# Patient Record
Sex: Male | Born: 1935 | Race: Black or African American | Hispanic: No | Marital: Single
Health system: Southern US, Community
[De-identification: ages and names within clinical notes are randomized; demographics above are authoritative.]

---

## 2006-12-10 ENCOUNTER — Encounter (INDEPENDENT_AMBULATORY_CARE_PROVIDER_SITE_OTHER): Payer: Self-pay | Admitting: *Deleted

## 2006-12-10 ENCOUNTER — Ambulatory Visit (HOSPITAL_COMMUNITY): Admission: RE | Admit: 2006-12-10 | Discharge: 2006-12-11 | Payer: Self-pay | Admitting: Ophthalmology

## 2006-12-12 ENCOUNTER — Emergency Department: Payer: Self-pay | Admitting: Emergency Medicine

## 2008-07-23 ENCOUNTER — Emergency Department: Payer: Self-pay | Admitting: Emergency Medicine

## 2010-01-23 ENCOUNTER — Ambulatory Visit: Payer: Self-pay | Admitting: Family Medicine

## 2010-01-25 ENCOUNTER — Ambulatory Visit: Payer: Self-pay | Admitting: Internal Medicine

## 2010-01-28 ENCOUNTER — Ambulatory Visit: Payer: Self-pay | Admitting: Internal Medicine

## 2010-02-01 ENCOUNTER — Ambulatory Visit: Payer: Self-pay | Admitting: Family Medicine

## 2010-12-10 NOTE — Op Note (Signed)
NAME:  Wayne Cruz, Wayne Cruz NO.:  [id number]   MEDICAL RECORD NO.:  [id number]          PATIENT TYPE:  AMB   LOCATION:  SDS                          FACILITY:  [facility]   PHYSICIAN:  John D. Ashley Royalty, M.D. DATE OF BIRTH:  1909-09-14   DATE OF PROCEDURE:  12/10/2006  DATE OF DISCHARGE:                               OPERATIVE REPORT   ADMISSION DIAGNOSIS:  1. Dislocated intraocular lens into the vitreous right eye.  2. Retained lens material, right eye.  3. Capsular fragments, right eye.   PROCEDURES:  1. Pars plana vitrectomy with a 25-gauge system.  2. Removal of posterior capsule.  3. Removal of lens material from the vitreous.  4. Removal of intraocular lens from the vitreous.  5. Placement of secondary intraocular lens with suture.  All in the      right eye.  6. Also photocoagulation in the right eye.   SURGEON:  Beulah Gandy. Ashley Royalty, M.D.   ASSISTANT:  Bryan Lemma. Lundquist, P.A.   ANESTHESIA:  General.   DETAILS:  Usual prep and drape.  Peritomy from 8 o'clock around to 4  o'clock.  The scleral flaps were raised one half thickness at 3 o'clock  and 9 o'clock in anticipation of IOL suture.  Then 25-gauge trocars were  placed at 8 o'clock, 10 o'clock and 2 o'clock.  The 4-mm infusion port  anchored into place at 8 o'clock.  A diamond knife was used to create a  3-layered corneoscleral wound from 10 o'clock to 2 o'clock.  The 25-  gauge cutter and lighted pick were placed at 10 and 2 o'clock  respectively.  Provisc was placed on the corneal surface.   The pars plana vitrectomy was begun.  Vitreous detachment from the iris  to the wound was removed with the vitreous cutter.  The vitrectomy was  carried posteriorly and pieces of the lens were removed with suction and  cutting.  The vitrectomy was carried out into the periphery down to the  vitreous base for 360-degrees.  All lens material and vitreous was  removed.  The indirect ophthalmoscope laser was moved into  place, 897  burns were placed around the retinal periphery with a power between 1000  and 1200 milliwatts, 1000 microns each, and 0.1 seconds each.   The intraocular lens was ensnared in vitreous.  The vitreous was  trimmed, carefully, from the lens; and it was allowed to float freely in  the vitreous.  The corneoscleral wound was opened from 10 to 2 o'clock  with the diamond knife.  The 25-gauge forceps were used to grasp the  lens in the posterior segment, past the lens into the anterior segment,  and out through the corneoscleral wound.  Then two 10-0 Prolene sutures  were passed from 3 o'clock to 9 o'clock behind the iris and anterior to  the capsular remnants.  The sutures were externalized through the  corneal wound.  A new intraocular lens was brought out onto the field,  made by [employer]. model CZ70-BD power 23.0 D, length 12.5  mm, optic 7.0  mm, expiration date 02/2011, serial number [id number] 0-20.  The lens was brought out onto the field, inspected and cleaned.  It was  placed onto Gelfoam and onto the corneal surface. The sutures were  attached to the eyelets of the lens.  The lens was passed through the  corneal wound behind the iris into the ciliary sulcus.  The lens was  dialed into place and the Prolene sutures were drawn securely  externally.  These were sutured beneath the scleral flaps and trimmed.   The cornea was closed with five interrupted 10-0 nylon sutures.  It was  tested and found to be tight.  Then the 25-gauge trocars were removed;  and the wounds were held until sealed.  The conjunctiva was reposited  with 7-0 chromic suture.  Polymyxin and gentamicin were irrigated into  Tenon's space.  Marcaine was injected around the globe for postop pain.  Decadron 10 mg was injected into the lower and upper subconjunctival  space.  The closing pressure was 10 with a Baer keratometer.  TobraDex  ophthalmic ointment, a patch and shield were placed.  The  patient was  awakened and taken to recovery room in satisfactory condition.  Complications none.  Duration 1 hour 15 minutes.      Beulah Gandy. Ashley Royalty, M.D.  Electronically Signed     JDM/MEDQ  D:  12/10/2006  T:  12/10/2006  Job:  [id number]

## 2013-09-10 LAB — URINALYSIS, COMPLETE
BACTERIA: NONE SEEN
BILIRUBIN, UR: NEGATIVE
BLOOD: NEGATIVE
Glucose,UR: NEGATIVE mg/dL (ref 0–75)
Ketone: NEGATIVE
Leukocyte Esterase: NEGATIVE
Nitrite: NEGATIVE
PH: 7 (ref 4.5–8.0)
RBC,UR: 1 /HPF (ref 0–5)
Specific Gravity: 1.008 (ref 1.003–1.030)
Squamous Epithelial: NONE SEEN

## 2013-09-10 LAB — CBC WITH DIFFERENTIAL/PLATELET
BASOS ABS: 0 10*3/uL (ref 0.0–0.1)
BASOS PCT: 0.4 %
EOS ABS: 0 10*3/uL (ref 0.0–0.7)
EOS PCT: 0.2 %
HCT: 43.3 % (ref 40.0–52.0)
HGB: 14.3 g/dL (ref 12.0–16.0)
LYMPHS ABS: 0.9 10*3/uL — AB (ref 1.0–3.6)
Lymphocyte %: 13.9 %
MCH: 33.6 pg (ref 26.0–34.0)
MCHC: 33.1 g/dL (ref 32.0–36.0)
MCV: 102 fL — AB (ref 80–100)
MONOS PCT: 4.4 %
Monocyte #: 0.3 10*3/uL (ref 0.2–1.0)
Neutrophil #: 5.2 10*3/uL (ref 1.4–6.5)
Neutrophil %: 81.1 %
Platelet: 68 10*3/uL — ABNORMAL LOW (ref 150–440)
RBC: 4.26 10*6/uL — AB (ref 4.40–5.90)
RDW: 15 % — AB (ref 11.5–14.5)
WBC: 6.4 10*3/uL (ref 3.8–10.6)

## 2013-09-10 LAB — COMPREHENSIVE METABOLIC PANEL
ALK PHOS: 105 U/L
ALT: 17 U/L (ref 12–78)
Albumin: 3.5 g/dL (ref 3.4–5.0)
Anion Gap: 4 — ABNORMAL LOW (ref 7–16)
BILIRUBIN TOTAL: 1.7 mg/dL — AB (ref 0.2–1.0)
BUN: 14 mg/dL (ref 7–18)
CHLORIDE: 103 mmol/L (ref 98–107)
CO2: 31 mmol/L (ref 21–32)
CREATININE: 0.95 mg/dL (ref 0.60–1.30)
Calcium, Total: 8.5 mg/dL (ref 8.5–10.1)
EGFR (Non-African Amer.): >60
GLUCOSE: 127 mg/dL — AB (ref 65–99)
OSMOLALITY: 278 (ref 275–301)
Potassium: 4.1 mmol/L (ref 3.5–5.1)
SGOT(AST): 25 U/L (ref 15–37)
Sodium: 138 mmol/L (ref 136–145)
TOTAL PROTEIN: 7.6 g/dL (ref 6.4–8.2)

## 2013-09-11 ENCOUNTER — Ambulatory Visit: Payer: Self-pay | Admitting: Urology

## 2013-09-11 LAB — BASIC METABOLIC PANEL
ANION GAP: 7 (ref 7–16)
BUN: 16 mg/dL (ref 7–18)
CALCIUM: 8.8 mg/dL (ref 8.5–10.1)
CREATININE: 1.22 mg/dL (ref 0.60–1.30)
Chloride: 103 mmol/L (ref 98–107)
Co2: 28 mmol/L (ref 21–32)
EGFR (Non-African Amer.): 48 — ABNORMAL LOW
GFR CALC AF AMER: 55 — AB
Glucose: 124 mg/dL — ABNORMAL HIGH (ref 65–99)
OSMOLALITY: 278 (ref 275–301)
POTASSIUM: 3.7 mmol/L (ref 3.5–5.1)
SODIUM: 138 mmol/L (ref 136–145)

## 2013-09-11 LAB — PLATELET COUNT: PLATELETS: 76 10*3/uL — AB (ref 150–440)

## 2013-09-12 ENCOUNTER — Inpatient Hospital Stay: Payer: Self-pay | Admitting: Surgery

## 2013-09-13 LAB — COMPREHENSIVE METABOLIC PANEL
ALBUMIN: 3.2 g/dL — AB (ref 3.4–5.0)
ALK PHOS: 76 U/L
ALT: 29 U/L (ref 12–78)
AST: 92 U/L — AB (ref 15–37)
Anion Gap: 4 — ABNORMAL LOW (ref 7–16)
BILIRUBIN TOTAL: 2.7 mg/dL — AB (ref 0.2–1.0)
BUN: 29 mg/dL — ABNORMAL HIGH (ref 7–18)
CHLORIDE: 104 mmol/L (ref 98–107)
CO2: 31 mmol/L (ref 21–32)
Calcium, Total: 8.5 mg/dL (ref 8.5–10.1)
Creatinine: 1.12 mg/dL (ref 0.60–1.30)
EGFR (African American): >60
GFR CALC NON AF AMER: 53 — AB
Glucose: 106 mg/dL — ABNORMAL HIGH (ref 65–99)
OSMOLALITY: 284 (ref 275–301)
POTASSIUM: 3.9 mmol/L (ref 3.5–5.1)
SODIUM: 139 mmol/L (ref 136–145)
Total Protein: 6.9 g/dL (ref 6.4–8.2)

## 2013-09-13 LAB — URINALYSIS, COMPLETE
Bacteria: NONE SEEN
RBC,UR: 9978 /HPF (ref 0–5)
SPECIFIC GRAVITY: 1.02 (ref 1.003–1.030)
WBC UR: 91 /HPF (ref 0–5)

## 2013-09-13 LAB — CBC WITH DIFFERENTIAL/PLATELET
Basophil #: 0 10*3/uL (ref 0.0–0.1)
Basophil %: 0.4 %
Eosinophil #: 0 10*3/uL (ref 0.0–0.7)
Eosinophil %: 0.4 %
HCT: 37.6 % — AB (ref 40.0–52.0)
HGB: 12.4 g/dL (ref 12.0–16.0)
LYMPHS ABS: 0.7 10*3/uL — AB (ref 1.0–3.6)
LYMPHS PCT: 8.4 %
MCH: 33.8 pg (ref 26.0–34.0)
MCHC: 33 g/dL (ref 32.0–36.0)
MCV: 103 fL — ABNORMAL HIGH (ref 80–100)
MONO ABS: 0.9 10*3/uL (ref 0.2–1.0)
MONOS PCT: 10.4 %
NEUTROS ABS: 6.9 10*3/uL — AB (ref 1.4–6.5)
Neutrophil %: 80.4 %
Platelet: 71 10*3/uL — ABNORMAL LOW (ref 150–440)
RBC: 3.67 10*6/uL — AB (ref 4.40–5.90)
RDW: 14.8 % — AB (ref 11.5–14.5)
WBC: 8.5 10*3/uL (ref 3.8–10.6)

## 2013-09-13 LAB — PATHOLOGY REPORT

## 2013-09-17 LAB — PLATELET COUNT: PLATELETS: 95 10*3/uL — AB (ref 150–440)

## 2013-09-21 ENCOUNTER — Inpatient Hospital Stay: Payer: Self-pay | Admitting: Internal Medicine

## 2013-09-21 LAB — URINALYSIS, COMPLETE
BILIRUBIN, UR: NEGATIVE
Glucose,UR: NEGATIVE mg/dL (ref 0–75)
NITRITE: NEGATIVE
Ph: 8 (ref 4.5–8.0)
Protein: 30
Specific Gravity: 1.014 (ref 1.003–1.030)

## 2013-09-21 LAB — COMPREHENSIVE METABOLIC PANEL
ALBUMIN: 3.1 g/dL — AB (ref 3.4–5.0)
ALK PHOS: 64 U/L
AST: 52 U/L — AB (ref 15–37)
Anion Gap: 3 — ABNORMAL LOW (ref 7–16)
BILIRUBIN TOTAL: 2 mg/dL — AB (ref 0.2–1.0)
BUN: 14 mg/dL (ref 7–18)
CALCIUM: 8.2 mg/dL — AB (ref 8.5–10.1)
CO2: 37 mmol/L — AB (ref 21–32)
Chloride: 98 mmol/L (ref 98–107)
Creatinine: 0.98 mg/dL (ref 0.60–1.30)
EGFR (African American): >60
EGFR (Non-African Amer.): >60
GLUCOSE: 111 mg/dL — AB (ref 65–99)
Osmolality: 277 (ref 275–301)
Potassium: 3.2 mmol/L — ABNORMAL LOW (ref 3.5–5.1)
SGPT (ALT): 40 U/L (ref 12–78)
SODIUM: 138 mmol/L (ref 136–145)
TOTAL PROTEIN: 6.9 g/dL (ref 6.4–8.2)

## 2013-09-21 LAB — PROTIME-INR
INR: 1.2
Prothrombin Time: 15.2 secs — ABNORMAL HIGH (ref 11.5–14.7)

## 2013-09-21 LAB — CBC
HCT: 36.9 % — ABNORMAL LOW (ref 40.0–52.0)
HGB: 12.2 g/dL (ref 12.0–16.0)
MCH: 33.5 pg (ref 26.0–34.0)
MCHC: 33.1 g/dL (ref 32.0–36.0)
MCV: 101 fL — AB (ref 80–100)
Platelet: 105 10*3/uL — ABNORMAL LOW (ref 150–440)
RBC: 3.64 10*6/uL — ABNORMAL LOW (ref 4.40–5.90)
RDW: 14.8 % — ABNORMAL HIGH (ref 11.5–14.5)
WBC: 4.9 10*3/uL (ref 3.8–10.6)

## 2013-09-21 LAB — CK-MB
CK-MB: 4.3 ng/mL — AB (ref 0.5–3.6)
CK-MB: 4.3 ng/mL — ABNORMAL HIGH (ref 0.5–3.6)
CK-MB: 4.7 ng/mL — AB (ref 0.5–3.6)

## 2013-09-21 LAB — TROPONIN I
TROPONIN-I: 4.1 ng/mL — AB
TROPONIN-I: 4.2 ng/mL — AB
Troponin-I: 4.3 ng/mL — ABNORMAL HIGH
Troponin-I: 4.1 ng/mL — ABNORMAL HIGH

## 2013-09-21 LAB — APTT: ACTIVATED PTT: 32.2 s (ref 23.6–35.9)

## 2013-09-22 LAB — CBC WITH DIFFERENTIAL/PLATELET
BASOS PCT: 0.7 %
Basophil #: 0 10*3/uL (ref 0.0–0.1)
Eosinophil #: 0 10*3/uL (ref 0.0–0.7)
Eosinophil %: 0.8 %
HCT: 33.7 % — ABNORMAL LOW (ref 40.0–52.0)
HGB: 11 g/dL — AB (ref 12.0–16.0)
Lymphocyte #: 0.5 10*3/uL — ABNORMAL LOW (ref 1.0–3.6)
Lymphocyte %: 14.1 %
MCH: 33.5 pg (ref 26.0–34.0)
MCHC: 32.8 g/dL (ref 32.0–36.0)
MCV: 102 fL — ABNORMAL HIGH (ref 80–100)
MONO ABS: 0.6 10*3/uL (ref 0.2–1.0)
MONOS PCT: 16.2 %
NEUTROS ABS: 2.6 10*3/uL (ref 1.4–6.5)
Neutrophil %: 68.2 %
PLATELETS: 87 10*3/uL — AB (ref 150–440)
RBC: 3.3 10*6/uL — ABNORMAL LOW (ref 4.40–5.90)
RDW: 14.9 % — AB (ref 11.5–14.5)
WBC: 3.8 10*3/uL (ref 3.8–10.6)

## 2013-09-22 LAB — BASIC METABOLIC PANEL
ANION GAP: 3 — AB (ref 7–16)
BUN: 11 mg/dL (ref 7–18)
CALCIUM: 7.5 mg/dL — AB (ref 8.5–10.1)
CHLORIDE: 99 mmol/L (ref 98–107)
CO2: 36 mmol/L — AB (ref 21–32)
Creatinine: 0.88 mg/dL (ref 0.60–1.30)
EGFR (African American): >60
GLUCOSE: 85 mg/dL (ref 65–99)
OSMOLALITY: 274 (ref 275–301)
POTASSIUM: 3.3 mmol/L — AB (ref 3.5–5.1)
Sodium: 138 mmol/L (ref 136–145)

## 2013-09-22 LAB — PROTIME-INR
INR: 1.2
Prothrombin Time: 15.4 secs — ABNORMAL HIGH (ref 11.5–14.7)

## 2013-09-22 LAB — MAGNESIUM: MAGNESIUM: 1.5 mg/dL — AB

## 2013-09-22 LAB — LIPID PANEL
Cholesterol: 79 mg/dL (ref 0–200)
HDL: 40 mg/dL (ref 40–60)
LDL CHOLESTEROL, CALC: 26 mg/dL (ref 0–100)
TRIGLYCERIDES: 65 mg/dL (ref 0–200)
VLDL Cholesterol, Calc: 13 mg/dL (ref 5–40)

## 2013-09-26 LAB — CULTURE, BLOOD (SINGLE)

## 2013-12-26 ENCOUNTER — Ambulatory Visit: Payer: Self-pay | Admitting: Internal Medicine

## 2014-01-02 ENCOUNTER — Inpatient Hospital Stay: Payer: Self-pay | Admitting: Internal Medicine

## 2014-01-02 LAB — CBC
HCT: 37.2 % — ABNORMAL LOW (ref 40.0–52.0)
HGB: 11.9 g/dL — ABNORMAL LOW (ref 12.0–16.0)
MCH: 33.5 pg (ref 26.0–34.0)
MCHC: 32.1 g/dL (ref 32.0–36.0)
MCV: 104 fL — AB (ref 80–100)
Platelet: 65 10*3/uL — ABNORMAL LOW (ref 150–440)
RBC: 3.56 10*6/uL — ABNORMAL LOW (ref 4.40–5.90)
RDW: 16.1 % — ABNORMAL HIGH (ref 11.5–14.5)
WBC: 3.9 10*3/uL (ref 3.8–10.6)

## 2014-01-02 LAB — CK-MB
CK-MB: 2 ng/mL (ref 0.5–3.6)
CK-MB: 2.1 ng/mL (ref 0.5–3.6)
CK-MB: 2.2 ng/mL (ref 0.5–3.6)

## 2014-01-02 LAB — COMPREHENSIVE METABOLIC PANEL
ALT: 25 U/L (ref 12–78)
ANION GAP: 6 — AB (ref 7–16)
Albumin: 3.3 g/dL — ABNORMAL LOW (ref 3.4–5.0)
Alkaline Phosphatase: 55 U/L
BUN: 17 mg/dL (ref 7–18)
Bilirubin,Total: 2.8 mg/dL — ABNORMAL HIGH (ref 0.2–1.0)
CHLORIDE: 95 mmol/L — AB (ref 98–107)
Calcium, Total: 8.5 mg/dL (ref 8.5–10.1)
Co2: 36 mmol/L — ABNORMAL HIGH (ref 21–32)
Creatinine: 0.88 mg/dL (ref 0.60–1.30)
EGFR (African American): >60
EGFR (Non-African Amer.): >60
GLUCOSE: 116 mg/dL — AB (ref 65–99)
Osmolality: 276 (ref 275–301)
POTASSIUM: 2.7 mmol/L — AB (ref 3.5–5.1)
SGOT(AST): 25 U/L (ref 15–37)
SODIUM: 137 mmol/L (ref 136–145)
Total Protein: 7.2 g/dL (ref 6.4–8.2)

## 2014-01-02 LAB — LIPASE, BLOOD: Lipase: 91 U/L (ref 73–393)

## 2014-01-02 LAB — MAGNESIUM: Magnesium: 2 mg/dL

## 2014-01-02 LAB — TROPONIN I
TROPONIN-I: 0.61 ng/mL — AB
Troponin-I: 0.52 ng/mL — ABNORMAL HIGH
Troponin-I: 0.68 ng/mL — ABNORMAL HIGH

## 2014-01-02 LAB — URINALYSIS, COMPLETE
BLOOD: NEGATIVE
Bacteria: NONE SEEN
Bilirubin,UR: NEGATIVE
GLUCOSE, UR: NEGATIVE mg/dL (ref 0–75)
Leukocyte Esterase: NEGATIVE
NITRITE: NEGATIVE
PH: 8 (ref 4.5–8.0)
Protein: 100
RBC,UR: 1 /HPF (ref 0–5)
Specific Gravity: 1.018 (ref 1.003–1.030)
Squamous Epithelial: <1
WBC UR: <1 /HPF (ref 0–5)

## 2014-01-02 LAB — PRO B NATRIURETIC PEPTIDE: B-TYPE NATIURETIC PEPTID: 11172 pg/mL — AB (ref 0–125)

## 2014-01-03 LAB — CBC WITH DIFFERENTIAL/PLATELET
BASOS PCT: 0.1 %
Basophil #: 0 10*3/uL (ref 0.0–0.1)
EOS ABS: 0 10*3/uL (ref 0.0–0.7)
EOS PCT: 0.2 %
HCT: 34.4 % — ABNORMAL LOW (ref 40.0–52.0)
HGB: 10.8 g/dL — ABNORMAL LOW (ref 12.0–16.0)
Lymphocyte #: 0.4 10*3/uL — ABNORMAL LOW (ref 1.0–3.6)
Lymphocyte %: 15.4 %
MCH: 33 pg (ref 26.0–34.0)
MCHC: 31.4 g/dL — ABNORMAL LOW (ref 32.0–36.0)
MCV: 105 fL — ABNORMAL HIGH (ref 80–100)
MONOS PCT: 21.8 %
Monocyte #: 0.6 10*3/uL (ref 0.2–1.0)
NEUTROS ABS: 1.8 10*3/uL (ref 1.4–6.5)
NEUTROS PCT: 62.5 %
PLATELETS: 63 10*3/uL — AB (ref 150–440)
RBC: 3.27 10*6/uL — AB (ref 4.40–5.90)
RDW: 15.6 % — AB (ref 11.5–14.5)
WBC: 2.8 10*3/uL — ABNORMAL LOW (ref 3.8–10.6)

## 2014-01-03 LAB — BASIC METABOLIC PANEL
ANION GAP: 5 — AB (ref 7–16)
BUN: 15 mg/dL (ref 7–18)
CHLORIDE: 98 mmol/L (ref 98–107)
Calcium, Total: 7.5 mg/dL — ABNORMAL LOW (ref 8.5–10.1)
Co2: 37 mmol/L — ABNORMAL HIGH (ref 21–32)
Creatinine: 0.79 mg/dL (ref 0.60–1.30)
EGFR (African American): >60
GLUCOSE: 106 mg/dL — AB (ref 65–99)
Osmolality: 281 (ref 275–301)
Potassium: 2.6 mmol/L — ABNORMAL LOW (ref 3.5–5.1)
Sodium: 140 mmol/L (ref 136–145)

## 2014-01-03 LAB — TSH: THYROID STIMULATING HORM: 1.23 u[IU]/mL

## 2014-01-04 LAB — CBC WITH DIFFERENTIAL/PLATELET
Basophil #: 0 10*3/uL (ref 0.0–0.1)
Basophil %: 0.1 %
EOS ABS: 0 10*3/uL (ref 0.0–0.7)
EOS PCT: 0.6 %
HCT: 36 % — ABNORMAL LOW (ref 40.0–52.0)
HGB: 11.6 g/dL — AB (ref 12.0–16.0)
LYMPHS ABS: 0.8 10*3/uL — AB (ref 1.0–3.6)
Lymphocyte %: 22 %
MCH: 33.7 pg (ref 26.0–34.0)
MCHC: 32.1 g/dL (ref 32.0–36.0)
MCV: 105 fL — AB (ref 80–100)
Monocyte #: 0.6 10*3/uL (ref 0.2–1.0)
Monocyte %: 17.5 %
Neutrophil #: 2.1 10*3/uL (ref 1.4–6.5)
Neutrophil %: 59.8 %
Platelet: 66 10*3/uL — ABNORMAL LOW (ref 150–440)
RBC: 3.43 10*6/uL — ABNORMAL LOW (ref 4.40–5.90)
RDW: 15.7 % — ABNORMAL HIGH (ref 11.5–14.5)
WBC: 3.6 10*3/uL — ABNORMAL LOW (ref 3.8–10.6)

## 2014-01-04 LAB — BASIC METABOLIC PANEL
ANION GAP: 5 — AB (ref 7–16)
BUN: 13 mg/dL (ref 7–18)
CALCIUM: 7.4 mg/dL — AB (ref 8.5–10.1)
CHLORIDE: 101 mmol/L (ref 98–107)
CO2: 36 mmol/L — AB (ref 21–32)
CREATININE: 0.96 mg/dL (ref 0.60–1.30)
EGFR (African American): >60
EGFR (Non-African Amer.): >60
GLUCOSE: 101 mg/dL — AB (ref 65–99)
Osmolality: 283 (ref 275–301)
POTASSIUM: 2.7 mmol/L — AB (ref 3.5–5.1)
Sodium: 142 mmol/L (ref 136–145)

## 2014-01-25 ENCOUNTER — Ambulatory Visit: Payer: Self-pay | Admitting: Internal Medicine

## 2014-02-25 DEATH — deceased

## 2014-11-18 NOTE — Consult Note (Signed)
   Comments   Spoke with wife and daughter. Both understand that patient does not seem to be doing well. We discussed option of continuing present care vs a transition to comfort care. Also discussed the Hospice Home. Wife would like to proceed with transfer to the Hospice Home. SW notified and will have the Hospice Liaison coordinate. Orders entered.  Electronic Signatures: Borders, Daryl EasternJoshua R (NP)  (Signed 10-Jun-15 14:36)  Authored: Palliative Care Phifer, Harriett SineNancy (MD)  (Signed 10-Jun-15 20:59)  Authored: Palliative Care   Last Updated: 10-Jun-15 20:59 by Phifer, Harriett SineNancy (MD)

## 2014-11-18 NOTE — Consult Note (Signed)
Brief Consult Note: Diagnosis: SBO.   Patient was seen by consultant.   Consult note dictated.   Recommend further assessment or treatment.   Orders entered.   Comments: Unable to obtain Hx except from chart. PE suggests SBO and is confirmed on CT. Agree with NG andreexamination.  Electronic Signatures: Lattie Hawooper, Rhyse Skowron E (MD)  (Signed 08-Jun-15 20:25)  Authored: Brief Consult Note   Last Updated: 08-Jun-15 20:25 by Lattie Hawooper, Jonmarc Bodkin E (MD)

## 2014-11-18 NOTE — H&P (Signed)
 Subjective/Chief Complaint painful VH   History of Present Illness sudden onset pain this am, no prior episode. no n/v no f/c unable to reduce in ED   Past History CAD, stents, no plavix  2 yrs ago HTN no prior abdominal surgery had TURp   Past Medical Health Coronary Artery Disease, Hypertension   Past Med/Surgical Hx:  palpitations:   cataract surgery:   ALLERGIES:  No Known Allergies:   Family and Social History:  Family History Non-Contributory   Social History negative tobacco, negative ETOH   Place of Living Home   Review of Systems:  Fever/Chills No   Cough No   Abdominal Pain Yes   Diarrhea No   Constipation No   Nausea/Vomiting No   SOB/DOE No   Chest Pain No   Dysuria No   Medications/Allergies Reviewed Medications/Allergies reviewed   Physical Exam:  GEN no acute distress   HEENT pink conjunctivae   NECK supple   RESP normal resp effort  clear BS   CARD regular rate   ABD positive tenderness  positive hernia  soft  nonreducible SUVH, no erythema,tender   LYMPH negative neck   EXTR negative edema   SKIN normal to palpation   PSYCH alert, A+O to time, place, person   Lab Results: Hepatic:  14-Feb-15 13:13   Albumin, Serum 3.5  Lab:  14-Feb-15 14:00   Lactic Acid, Cardiopulmonary  2.0 (Result(s) reported on 10 Sep 2013 at 02:46PM.)  Routine Chem:  14-Feb-15 13:13   Glucose, Serum  127  BUN 14  Creatinine (comp) 0.95  Sodium, Serum 138  Potassium, Serum 4.1  Chloride, Serum 103  CO2, Serum 31  Calcium (Total), Serum 8.5  Osmolality (calc) 278  eGFR (African American) >60  eGFR (Non-African American) >60 (eGFR values <60mL/min/1.73 m2 may be an indication of chronic kidney disease (CKD). Calculated eGFR is useful in patients with stable renal function. The eGFR calculation will not be reliable in acutely ill patients when serum creatinine is changing rapidly. It is not useful in  patients on dialysis. The eGFR  calculation may not be applicable to patients at the low and high extremes of body sizes, pregnant women, and vegetarians.)  Anion Gap  4  Routine UA:  14-Feb-15 13:14   Color (UA) Yellow  Clarity (UA) Clear  Glucose (UA) Negative  Bilirubin (UA) Negative  Ketones (UA) Negative  Specific Gravity (UA) 1.008  Blood (UA) Negative  pH (UA) 7.0  Protein (UA) 30 mg/dL  Nitrite (UA) Negative  Leukocyte Esterase (UA) Negative (Result(s) reported on 10 Sep 2013 at 02:15PM.)  RBC (UA) 1 /HPF  WBC (UA) 2 /HPF  Bacteria (UA) NONE SEEN  Epithelial Cells (UA) NONE SEEN  Result(s) reported on 10 Sep 2013 at 02:15PM.  Routine Hem:  14-Feb-15 13:13   WBC (CBC) 6.4  RBC (CBC)  4.26  Hemoglobin (CBC) 14.3  Hematocrit (CBC) 43.3  MCV  102  MCH 33.6  MCHC 33.1  RDW  15.0  Neutrophil % 81.1  Lymphocyte % 13.9  Monocyte % 4.4  Eosinophil % 0.2  Basophil % 0.4  Neutrophil # 5.2  Lymphocyte #  0.9  Monocyte # 0.3  Eosinophil # 0.0  Basophil # 0.0 (Result(s) reported on 10 Sep 2013 at 02:15PM.)    Assessment/Admission Diagnosis inc VH, not reducible by me or ED MD rec urgent repair risks and options in detail   Electronic Signatures: Cooper, Richard E (MD)  (Signed 14-Feb-15 14:52)  Authored: CHIEF COMPLAINT and   HISTORY, PAST MEDICAL/SURGIAL HISTORY, ALLERGIES, FAMILY AND SOCIAL HISTORY, REVIEW OF SYSTEMS, PHYSICAL EXAM, LABS, ASSESSMENT AND PLAN   Last Updated: 14-Feb-15 14:52 by Florene Glen (MD)

## 2014-11-18 NOTE — Consult Note (Signed)
Admit Diagnosis:   HERNIA REPAIR: Onset Date: 11-Sep-2013, Status: Active, Description: HERNIA REPAIR      Admit Reason:   Incarcerated ventral hernia (552.20): Onset Date: 10-Sep-2013, Status: Active, Coding System: ICD9, Coded Name: Ventral hernia, unspecified, with obstruction  Home Medications: Medication Instructions Status  acetaminophen-HYDROcodone 325 mg-5 mg oral tablet 1 tab(s) orally every 4 hours, As needed, pain Active  furosemide 20 mg oral tablet 1 tab(s) orally once a Aoun Active  finasteride 5 mg oral tablet 1 tab(s) orally once a Shatzer Active  Metoprolol Tartrate 25 mg oral tablet 1 tab(s) orally 2 times a Mcinerny Active  atorvastatin 80 mg oral tablet 1 tab(s) orally once a Makarewicz Active  ranitidine 150 mg oral tablet 1 tab(s) orally once a Gittings Active  aspirin 81 mg oral tablet, chewable 1 tab(s) orally once a Eppes Active  tamsulosin 0.4 mg oral capsule 1 cap(s) orally once a Kidd Active   Lab Results: Routine Chem:  15-Feb-15 04:58   Glucose, Serum  124  BUN 16  Creatinine (comp) 1.22  Sodium, Serum 138  Potassium, Serum 3.7  Chloride, Serum 103  CO2, Serum 28  Calcium (Total), Serum 8.8  Anion Gap 7  Osmolality (calc) 278  eGFR (African American)  55  eGFR (Non-African American)  48 (eGFR values <78mL/min/1.73 m2 may be an indication of chronic kidney disease (CKD). Calculated eGFR is useful in patients with stable renal function. The eGFR calculation will not be reliable in acutely ill patients when serum creatinine is changing rapidly. It is not useful in  patients on dialysis. The eGFR calculation may not be applicable to patients at the low and high extremes of body sizes, pregnant women, and vegetarians.)  Routine Hem:  15-Feb-15 04:58   Platelet Count (CBC)  76 (Result(s) reported on 11 Sep 2013 at 06:37AM.)    No Known Allergies:   Nursing Flowsheets: **Vital Signs.:   15-Feb-15 10:24  Vital Signs Type Q 4hr  Temperature Temperature (F) 99   Celsius 37.2  Pulse Pulse 90  Respirations Respirations 18  Systolic BP Systolic BP 259  Diastolic BP (mmHg) Diastolic BP (mmHg) 58  Mean BP 77  Pulse Ox % Pulse Ox % 93  Pulse Ox Activity Level  At rest  Oxygen Delivery 3L    Present Illness 81 yo male s/p ventral hernia repair with acute urinary retention.  Foley placement attempted with regular and coude catheters without success.  Patient reportedly has a history of TURP in the past.  800 ml on bladder scan.  No other known urologic history.  He is on flomax.   Case History and Physical Exam:  Chief Complaint Acute urinary retention   Past Medical Health Coronary Artery Disease, Hypertension   Past Surgical History ventral hernia repair   Family History Non-Contributory   HEENT PERLA   Neck/Nodes Supple   Chest/Lungs Clear   Cardiovascular Normal Sinus Rhythm   Abdomen Benign   Genitalia WNL   Rectal Not examined    Impression Acute urinary retention   Plan -Under sterile conditions, a 20 Fr coude catheter was passed easily and atraumatically into the bladder with return of clear yellow urine.  Continue flomax.  OK to try another voiding trial in 48 hours or so.  If foley needs to be replaced, a 20 Fr coude should be used and can be placed by nursing staff since no special maneuvers were required for placement.  Follow up with home urologist.   Electronic Signatures: Lenoria Chime  J (MD)  (Signed 15-Feb-15 13:29)  Authored: Health Issues, Home Medications, Labs, Allergies, Vital Signs, General Aspect/Present Illness, History and Physical Exam, Impression/Plan   Last Updated: 15-Feb-15 13:29 by Burnard Hawthorne (MD)

## 2014-11-18 NOTE — Consult Note (Signed)
General Aspect 81 year old African American male brought to the ER, from a local nursing facility, due to cough and chest x-ray showing possible pneumothorax but CT of the chest here revealed small right pleural effusion and diffuse CAD by CT.  Patient has been very hypertensive with blood pressures running 170-180.   Troponin is elevated at 4.20.  EKG shows chronic A. fib, rate controlled.  No acute ST-T wave changes.Patient is noncommunicative with me during the visit, no family present and history is obtained from the chart.  Patient did present also with low platelets at 95, now 105.  He is on oxygen, nitroglycerin, baby aspirin, will continue, but no other anticoagulants due to low platelets, beta blocker, statin.Echocardiogram will be obtained today.  Patient does have history of hematuria and was seen by urologist on an outpatient basis.  Hematuria thought secondary to BPH.   Physical Exam:  GEN no acute distress   HEENT pink conjunctivae, hearing intact to voice, moist oral mucosa   RESP normal resp effort  crackles  Congestive cough   CARD Irregular rate and rhythm  Chronic A. fib, rate controlled   ABD soft   EXTR negative edema, 2+   SKIN skin turgor decreased   PSYCH lethargic, Will open eyes to voice but noncommunicative   Review of Systems:  Subjective/Chief Complaint Congested cough with abnormal chest x-ray   ROS Pt not able to provide ROS   Medications/Allergies Reviewed Medications/Allergies reviewed   Lab Results: Hepatic:  24-Feb-15 23:51   Bilirubin, Total  2.0  Alkaline Phosphatase 64 (45-117 NOTE: New Reference Range 06/17/13)  SGPT (ALT) 40  SGOT (AST)  52  Total Protein, Serum 6.9  Albumin, Serum  3.1  Routine Chem:  24-Feb-15 23:51   Result Comment TROPONIN - RESULTS VERIFIED BY REPEAT TESTING.  - CALLED TO AMANDA NORTHRUP AT 5701 ON  - 09/21/13.Marland KitchenMarland KitchenTPL  - READ-BACK PROCESS PERFORMED.  Result(s) reported on 21 Sep 2013 at 12:51AM.  Glucose,  Serum  111  BUN 14  Creatinine (comp) 0.98  Sodium, Serum 138  Potassium, Serum  3.2  Chloride, Serum 98  CO2, Serum  37  Calcium (Total), Serum  8.2  Osmolality (calc) 277  eGFR (African American) >60  eGFR (Non-African American) >60 (eGFR values <29m/min/1.73 m2 may be an indication of chronic kidney disease (CKD). Calculated eGFR is useful in patients with stable renal function. The eGFR calculation will not be reliable in acutely ill patients when serum creatinine is changing rapidly. It is not useful in  patients on dialysis. The eGFR calculation may not be applicable to patients at the low and high extremes of body sizes, pregnant women, and vegetarians.)  Anion Gap  3  Cardiac:  24-Feb-15 23:51   Troponin I  4.20 (0.00-0.05 0.05 ng/mL or less: NEGATIVE  Repeat testing in 3-6 hrs  if clinically indicated. >0.05 ng/mL: POTENTIAL  MYOCARDIAL INJURY. Repeat  testing in 3-6 hrs if  clinically indicated. NOTE: An increase or decrease  of 30% or more on serial  testing suggests a  clinically important change)  CPK-MB, Serum  4.7 (Result(s) reported on 21 Sep 2013 at 01:51AM.)  Routine Coag:  24-Feb-15 23:51   Prothrombin  15.2  INR 1.2 (INR reference interval applies to patients on anticoagulant therapy. A single INR therapeutic range for coumarins is not optimal for all indications; however, the suggested range for most indications is 2.0 - 3.0. Exceptions to the INR Reference Range may include: Prosthetic heart valves, acute myocardial infarction,  prevention of myocardial infarction, and combinations of aspirin and anticoagulant. The need for a higher or lower target INR must be assessed individually. Reference: The Pharmacology and Management of the Vitamin K  antagonists: the seventh ACCP Conference on Antithrombotic and Thrombolytic Therapy. KAJGO.1157 Sept:126 (3suppl): [id number]. A HCT value >55% may artifactually increase the PT.  In one study,  the increase  was an average of 25%. Reference:  "Effect on Routine and Special Coagulation Testing Values of Citrate Anticoagulant Adjustment in Patients with High HCT Values." American Journal of Clinical Pathology 2006;126:400-405.)  Activated PTT (APTT) 32.2 (A HCT value >55% may artifactually increase the APTT. In one study, the increase was an average of 19%. Reference: "Effect on Routine and Special Coagulation Testing Values of Citrate Anticoagulant Adjustment in Patients with High HCT Values." American Journal of Clinical Pathology 2006;126:400-405.)  Routine Hem:  24-Feb-15 23:51   WBC (CBC) 4.9  RBC (CBC)  3.64  Hemoglobin (CBC) 12.2  Hematocrit (CBC)  36.9  Platelet Count (CBC)  105 (Result(s) reported on 21 Sep 2013 at 12:10AM.)  MCV  101  MCH 33.5  MCHC 33.1  RDW  14.8   Radiology Results: CT:    25-Feb-15 00:06, CT Chest Without Contrast  CT Chest Without Contrast   REASON FOR EXAM:    sent from nursing home with possible pneumothorax,   cough with productive sputum  COMMENTS:       PROCEDURE: CT  - CT CHEST WITHOUT CONTRAST  - Sep 21 2013 12:06AM     CLINICAL DATA:  Productive cough. Question of pneumothorax onchest  radiograph performed at outside facility.    EXAM:  CT CHEST WITHOUT CONTRAST    TECHNIQUE:  Multidetector CT imaging of the chest was performed following the  standard protocol without IV contrast.  COMPARISON:  None.    FINDINGS:  A small right pleural effusion is noted, with minimal associated  atelectasis. There is no evidence of pneumothorax. A few scattered  blebs are noted at the lung apices. Minimal left basilar atelectasis  is also noted. Evaluation is mildly suboptimal due to motion  artifact. No significant focal consolidation is identified. No  suspicious masses are seen.    Diffuse coronary artery calcifications are seen. Mediastinum is  otherwise unremarkable in appearance. No mediastinal lymphadenopathy  is seen. No pericardial  effusion is identified. Scattered  calcification is seen along the aortic arch and proximal great  vessels. The thyroid gland is grossly unremarkable in appearance. No  axillary lymphadenopathy is seen.    A 3.3 x 1.9 cm subcutaneous cystic nodule anterior and to the left  side of the inferior sternum may reflect a benign cyst; would  correlate for associated skin findings.    The visualized portions of the liver and spleen are unremarkable in  appearance, though difficult to fully assess due to motion artifact.  The visualized portions of the pancreas, adrenal glands and both  kidneys are grossly unremarkable.    No acute osseous abnormalities are identified.    The hemostat identified on the scout image is outside the patient,  per clinical correlation.   IMPRESSION:  1. No evidence of pneumothorax.  2. Small right pleural effusion, with minimal associated  atelectasis. Few scattered blebs at the lung apices. Minimal left  basilar atelectasis also seen.  3. Diffuse coronary artery calcifications seen.  4. 3.3 x 1.9 cm subcutaneous cystic nodule anterior and to the left  side of the inferior sternum may reflect a benign cyst; would  correlate for associated skin findings.      Electronically Signed    By: Garald Balding M.D.   On: 09/21/2013 00:18     Verified By: JEFFREY . CHANG, M.D.,    No Known Allergies:   Vital Signs/Nurse's Notes: **Vital Signs.:   25-Feb-15 05:00  Vital Signs Type Recheck  Systolic BP Systolic BP 301  Diastolic BP (mmHg) Diastolic BP (mmHg) 78  Mean BP 111    07:50  Vital Signs Type Pre Medication  Pulse Pulse 79  Systolic BP Systolic BP 499  Diastolic BP (mmHg) Diastolic BP (mmHg) 85  Mean BP 119    Impression 1.NSTEMI   Plan 1.  Continue O2, nitroglycerin, aspirin, beta blocker, statin for NSTEMI. Avoid other blood thinners due to history of hematuria and low platelets. 2.  Echocardiogram pending. 3.  Further recommendations per Dr.  Nehemiah Massed once results of echo are known. 4.  Chronic A. fib well rate controlled at this time not a candidate for anticoagulation other than aspirin.  Roderic Palau, NP.   Electronic Signatures: Roderic Palau (NP)  (Signed [phone] 09:59)  Authored: General Aspect/Present Illness, History and Physical Exam, Review of System, Labs, Radiology, Allergies, Vital Signs/Nurse's Notes, Impression/Plan   Last Updated: 25-Feb-15 09:59 by Roderic Palau (NP)

## 2014-11-18 NOTE — H&P (Signed)
PATIENT NAME:  Wayne Cruz, Wayne Cruz MR#:  [id number] DATE OF BIRTH:  03-Nov-1909  DATE OF ADMISSION:  01/02/2014  PRIMARY CARE PROVIDER: Duanne Limerickeanna C. Jones, MD  EMERGENCY DEPARTMENT REFERRING PHYSICIAN: Dr. Margarita GrizzleWoodruff.   CHIEF COMPLAINT: Nausea, vomiting.   HISTORY OF PRESENT ILLNESS: The patient is a 81 year old African American male with history of gastroesophageal reflux disease, hyperlipidemia, benign prostatic hypertrophy, who was last hospitalized on 09/21/2013 with likely acute bronchitis and a non-Q wave myocardial infarction. Medical management was recommended, who currently stays at home. He is brought in because of nausea and vomiting since yesterday. The patient does have dementia and is not able to give me any history. His wife is at the bedside, who states that he has been throwing up bilish-like material since today. Yesterday started with just clearish, yellowish material. He has not complained of any abdominal pain. The patient came in the ED and had an evaluation with a CT scan of the abdomen which showed small bowel obstruction secondary to transition zone in the right mid abdomen. The ED physician spoke to Dr. Egbert GaribaldiBird, who recommended a medical admission. The patient, again, is not able to give me any history.   PAST MEDICAL HISTORY:  Hypertension, hyperlipidemia, history of palpitations, chronic atrial fibrillation, benign prostatic hypertrophy, history of hematuria, likely coronary artery disease.   PAST SURGICAL HISTORY: Status post hernia repair and cataract surgery. As well as coronary stents.   ALLERGIES: None.   SOCIAL HISTORY: No history of smoking. Currently resides at home with his wife.   HIS MEDICATIONS AT HOME: He is on Flomax 0.4 daily, metoprolol tartrate 25 mg 1/2 tab p.o. b.i.d., Lasix 20 mg 1 tab p.o. daily, atorvastatin 80 daily, aspirin 81 mg 1 tab p.o. daily, Tylenol, hydrocodone 325/5 q.6 p.r.n.   REVIEW OF SYSTEMS: Unobtainable due to patient being confused.    PHYSICAL EXAMINATION: VITAL SIGNS: Temperature 98.3, pulse 75, respirations 19, blood pressure 193/90, O2 of 98% on room air.  GENERAL: The patient is a chronically ill-appearing male in no acute distress.  HEENT: Head atraumatic, normocephalic. Pupils equally round, reactive to light and accommodation. There is no conjunctival pallor. No scleral icterus. Nasal exam shows no drainage or ulceration. Oropharynx is clear without any exudate.  NECK: Supple without any JVD.  CARDIOVASCULAR: Irregularly irregular rhythm. No murmurs, rubs, clicks or gallops.  LUNGS: Clear to auscultation bilaterally without any rales, rhonchi, wheezing.  ABDOMEN: Distended. There is tenderness, no guarding. There is no bowel sounds.  EXTREMITIES: He has got 2+ edema.  SKIN: No rash.  LYMPHATICS: No lymph nodes palpable.  VASCULAR: Good DP, PT pulses.  PSYCHIATRIC: Confused.  NEUROLOGIC:  Awake but not oriented to place, person or time.   LABORATORY, DIAGNOSTIC AND RADIOLOGICAL DATA:  CT of the abdomen and pelvis shows bilateral pleural effusions with left lower lobe infiltrate, small bowel obstruction secondary transition zone in the right, chronic changes, other changes noted. CT scan of the head shows partial opacification of inferior right mastoid cells, global atrophy. BNP is 11,172. WBC 3.9, hemoglobin 11.9, platelet count is 75. Glucose 116, BUN 17, creatinine 0.88, sodium 137, potassium 2.7, chloride 95, CO2 is 36. LFTs are normal except albumin at 3.3. Troponin 0.52, lipase 91, CK-MB 2.0.   ASSESSMENT AND PLAN: The patient is 81 year old [place] male with history of likely coronary artery disease, hypertension, hyperlipidemia, chronic atrial fibrillation, brought in with nausea and vomiting. Noted to have a small bowel obstruction.  1.  Small bowel obstruction. We will continue  nasogastric tube to low wall suctioning. We will provide the patient with supportive care, surgery consult.  2.   Hypertension. We will hold the blood pressure medications for now. We will do p.r.n. hydralazine.  3.  Chronic atrial fibrillation. Resume aspirin once taking p.o.   4.  Benign prostatic hypertrophy. We will hold Flomax.  5.  Hypokalemia. We will replace potassium with IV potassium chloride.  6.  Troponin, likely due to demand ischemia. He could also have coronary artery disease. We will just monitor his cardiac enzymes.  7.  CODE STATUS discussed with wife. He is a DO NOT RESUSCITATE.  TIME SPENT: 50 minutes.    ____________________________ Lacie Scotts Allena Katz, MD shp:cs D: 01/02/2014 14:17:00 ET T: 01/02/2014 14:45:48 ET JOB#: [id number]  cc: Josealfredo Adkins H. Allena Katz, MD, <Dictator> Charise Carwin MD ELECTRONICALLY SIGNED 01/10/2014 16:57

## 2014-11-18 NOTE — Discharge Summary (Signed)
PATIENT NAME:  [name], [name] MR#:  [id number] DATE OF BIRTH:  1910/04/25  DATE OF ADMISSION:  09/12/2013 DATE OF DISCHARGE:  09/18/2013  ATTENDING PHYSICIAN: Dr. Salome Holmeshris Trebor Galdamez   DISCHARGE DIAGNOSES: 1. Incarcerated painful ventral hernia status post ventral hernia repair.  2. Coronary artery disease.  3. Hematuria.  4. History of hypertension.  5. History of transurethral resection of the prostate.  6. History of hypertension.   DISCHARGE MEDICATIONS: As follows: Lasix 20 mg p.o. daily. Finasteride 5 mg p.o. daily. Metoprolol 25 mg p.o. b.i.d. atorvastatin 80 mg p.o. daily. Ranitidine 150 mg p.o. daily.  Aspirin 81 mg p.o. daily. Flomax 0.5 p.o. daily. Norco 1 tab p.o. q.6 hours p.r.n. pain.   INDICATION FOR ADMISSION: Mr. Wayne Cruz is a pleasant [age] year old who presented with painful incarcerated ventral hernia. He was admitted for management of ventral hernia.   HOSPITAL COURSE: Mr. Wayne Cruz was admitted with painful ventral hernia and went to the Operating Room. He had a repair of hernia primarily. Postoperatively, he had difficulty urinating and has had a catheter placed. Throughout the hospital course, his pain had improved. He initially had some confusion which has since resolved. He appears to be mildly confused currently. Urology was reconsulted, 02/19 for persistent hematuria and it was thought due to prosthetic trauma and Foley was later discharged on 02/20, and he is currently voiding and taking good p.o. with good p.o. pain controls. At the time of discharge his mental status had improved.   DISCHARGE INSTRUCTIONS: Mr. Wayne Cruz is to follow up with Dr. Excell Seltzerooper in approximately 7 to 10 days. He is to call or return to the ED if he has increased pain, nausea, vomiting, redness, drainage from incision or difficulty urinating.   ____________________________ Si Raiderhristopher A. Welford Christmas, MD cal:sg D: 09/18/2013 08:45:00 ET T: 09/18/2013 09:25:50 ET JOB#: [id number]  cc: Cristal Deerhristopher A. Majesta Leichter, MD,  <Dictator> Jarvis NewcomerHRISTOPHER A Areebah Meinders MD ELECTRONICALLY SIGNED 09/21/2013 7:55

## 2014-11-18 NOTE — Consult Note (Signed)
   Comments   Follow up visit made. Wife at bedside. Updated her on pt's condition. She seems to understand the high risk for sgy and that conservative tx is the only option for pt. She also understands that pt may not respond to conservative tx and that he may be approaching the end of life. She will be here tomorrow and we agreed to meet again to discuss goals of therapy further.  Electronic Signatures: Aeneas Longsworth, Harriett SineNancy (MD)  (Signed 09-Jun-15 20:43)  Authored: Palliative Care   Last Updated: 09-Jun-15 20:43 by Juanluis Guastella, Harriett SineNancy (MD)

## 2014-11-18 NOTE — Discharge Summary (Signed)
PATIENT NAME:  Wayne Cruz, Reyhan J MR#:  [id number] DATE OF BIRTH:  Jan 27, 1910  DATE OF ADMISSION:  09/21/2013 DATE OF DISCHARGE:  09/23/2013  PRESENTING COMPLAINT: Cough.   DISCHARGE DIAGNOSES: 1.  Acute bronchitis.  2.  Acute non-Q-wave myocardial infarction, medical management per Cardiology recommendations.  3.  Urinary tract infection.  4.  History of benign prostatic hypertrophy.  5.  Hypertension.  6.  Hyperlipidemia.   VITAL SIGNS:  97.9, pulse is 71. Blood pressure is 155/84, sats are 97% on 2 liters.   CONSULTATION:  Cardiology consultation with Dr. Gwen PoundsKowalski.   DIET: Mechanical soft diet.   MEDICATIONS: 1.  Levaquin 750 p.o. q. 48 for 3 more doses.  2.  Metoprolol 25 b.i.d.  3.  Flomax 0.4 mg p.o. daily.  4.  Nitroglycerin 0.4 mg sublingually p.r.n.  5.  Protonix 40 mg daily.  6.  Norco 5/325, one tablet q. 6 p.r.n.  7.  Aspirin 81 mg daily.  8.  Proscar 5 mg daily.  9.  Atorvastatin 80 mg daily.   DISCHARGE INSTRUCTIONS:   1.  PT if patient tolerates.  2.  Followup with Dr. Gwen PoundsKowalski, cardiology in 2 weeks.   CODE STATUS: FULL CODE.   LABORATORY DATA: At discharge: Echo Doppler shows LVEF of 50% to 55%, normal left ventricular systolic function. Mild mitral valve regurgitation, mild tricuspid regurgitation, mildly elevated pulmonary artery pressure.   White count was 3.8, hemoglobin and hematocrit 11.0 and 33.7, platelet count is 87. MCV is 102. Basic metabolic panel within normal limits. PT/INR is 15.4 and 1.2. Lipid profile within normal limits. Troponin is 4.10, 4.10, 4.30. Blood cultures: No growth in 48 hours. UA positive for RBCs.   BRIEF SUMMARY OF HOSPITAL COURSE: Mr. Wayne Cruz is a 81 year old African American gentleman, brought in from the nursing home for cough. He was admitted with:  1.  Acute bronchitis. His blood cultures remained negative. He was started on IV Levaquin. He will finish up the Levaquin course.  He remained afebrile. His sats were stable at 97% to  98% on 2 liters. We will wean off oxygen prior to discharge.  2.  Acute cystitis with history of hematuria and in the setting of BPH.  The patient has been on antibiotics at this time. He has being seeing Urology as outpatient.  He was evaluated recently by Dr. Achilles Dunkope as outpatient. The patient will follow up with Dr. Achilles Dunkope as outpatient.  3.  Acute non-Q-wave MI in the setting of thrombocytopenia. The patient was on oxygen, aspirin nitroglycerin, beta blockers and statins. He was not given antiplatelet agents other than the aspirin given his thrombocytopenia, which remained stable. He was seen by Dr. Gwen PoundsKowalski who recommends medical management. The patient hemodynamically remained stable.  4.  History of BPH with recent history of hematuria on February 19th.  The patient follows up with Dr. Achilles Dunkope.  Continue his Proscar and tamsulosin.  5.  Hypertension, on beta blockers.  6.  Hyperlipidemia. Continue statins.   The patient is from [facility] who will be discharged today. The discharge plan was discussed with the patient's daughter, Adell.    TIME SPENT: 40 minutes.    ____________________________ Wylie HailSona A. Allena KatzPatel, MD sap:dp D: 09/23/2013 11:07:19 ET T: 09/23/2013 11:46:51 ET JOB#: [id number]  cc: Yesmin Mutch A. Allena KatzPatel, MD, <Dictator> Lamar BlinksBruce J. Kowalski, MD Madolyn FriezeBrian S. Achilles Dunkope, MD Willow OraSONA A Caryssa Elzey MD ELECTRONICALLY SIGNED 10/04/2013 15:23

## 2014-11-18 NOTE — Op Note (Signed)
PATIENT NAME:  Wayne Cruz, Wayne Cruz MR#:  [id number] DATE OF BIRTH:  [birth year]  DATE OF PROCEDURE:  09/10/2013  PREOPERATIVE DIAGNOSIS: Incarcerated ventral hernia.  POSTOPERATIVE DIAGNOSIS:  Incarcerated ventral hernia.  PROCEDURE:  Repair of incarcerated ventral hernia.   SURGEON: Dionne Miloichard Patriciaann Rabanal, M.D.   ANESTHESIA: General with LMA.   INDICATIONS FOR PROCEDURE: This is a 81 year old gentleman with a history of incarcerated ventral hernia. It is not reducible and requires emergent surgery. Preoperatively, we discussed rationale for surgical repair, the options of observation, risk of bleeding, infection, recurrence, bowel injury, mesh placement, mesh infection, the inability to use mesh under most circumstances such as this and cosmetic deformity. This was reviewed for him and his family in the preop holding area as well. They understood and agreed to proceed.   FINDINGS: Small hernial rent with a loop of omentum which was frankly ischemic and a loop of small intestine, which initially was ischemic but became pink with release of the incarceration.   DESCRIPTION OF PROCEDURE: The patient was induced to general anesthesia. He was given IV antibiotics. VTE prophylaxis was in place. He was prepped and draped in a sterile fashion. Marcaine was infiltrated in skin and subcutaneous tissues around the palpable visible mass in the supraumbilical area. Midline incision was utilized to open and explore the subcutaneous tissues and, in so doing, a large incarcerated hernial sac was identified. That hernia sac was opened to reveal ischemic omentum and a loop of small bowel much like a Richter hernia. The hernial rent was enlarged on the midline to allow for release of the tension on the blood supply to the small intestine. The small intestine was placed back into the abdominal cavity. The omentum was elevated and the ischemic areas were divided between clamps and tied with 0 silk ligatures and placed back into the  abdominal cavity. The specimen was sent off for examination.  The small bowel was elevated and inspected and it had immediately become pink from its previous dark ischemic condition. It was placed back into the abdominal cavity again and observed until it was clear that it was going to remain with a good blood supply. It was elevated and evaluated and found to have a 2 mm serosal tear near the edge of the ischemic area. This was inverted with 3-0 silk Lembert-type sutures and then placed back into the abdominal cavity. Again, after significant time had passed, it was clear that the ischemia was reversed and there was no sign of ischemia necessitating resection. Therefore, the hernia repair was performed with interrupted horizontal mattress sutures of 0 Ethibond. Additional Marcaine was placed for a total of 30 mL. The wound was then closed with 2 layers of 0 Vicryl and skin staples were placed, as was a sterile dressing.  The patient tolerated this procedure well. There were no complications. He was taken to the recovery room in stable condition after placing a dry dressing, to be admitted for continued care.    ____________________________ Adah Salvageichard E. Excell Seltzerooper, MD rec:ce D: 09/10/2013 16:34:46 ET T: 09/10/2013 23:26:49 ET JOB#: [id number]  cc: Adah Salvageichard E. Excell Seltzerooper, MD, <Dictator> Lattie HawICHARD E Forrestine Lecrone MD ELECTRONICALLY SIGNED 09/11/2013 7:21

## 2014-11-18 NOTE — H&P (Signed)
PATIENT NAME:  Wayne DimitriDAY, Dodd J MR#:  [id number] DATE OF BIRTH:  1909/11/02  DATE OF ADMISSION:  09/21/2013  PRIMARY CARE PHYSICIAN:  Dr. Elizabeth Sauereanna Jones.  REFERRING PHYSICIAN:  Dr. Zenda AlpersWebster.  CHIEF COMPLAINT:  Cough and possible pneumothorax.   HISTORY OF PRESENT ILLNESS:  The patient is a 81 year old Caucasian male with a past medical history of hypertension, GERD, hyperlipidemia and benign prostatic hypertrophy, residing in a nursing home, is sent over to the ER for cough and possible pneumothorax.  The patient has been coughing for a while and chest x-ray was done at the nursing home which has revealed pneumothorax.  The patient is sent over to the ER.  Here, CAT scan of the chest with contrast was done which has revealed no evidence of pneumothorax, but a small right-sided pleural effusion.  The patient has been coughing, but no pneumonia identified.  Blood cultures were obtained and the patient was given Zosyn and levofloxacin in the ER.  The patient's troponin is elevated at 4.2 and CPK MB is at 4.7.  The patient's platelet count is low at 105.  The patient was just recently seen by urologist, Dr. Achilles Dunkope regarding hematuria.  Foley catheter was placed at that time and it was assumed to be from benign prostatic hypertrophy.  During my examination, the patient is complaining of pain, but he did not mention where exactly he is hurting.  He is a poor historian.  No family members are at bedside.  We have tried to reach wife.  We could not reach her and we were not able to leave a voicemail as it was not connected to voicemail.  As the patient has multiple bruises and platelet count being low at 105 Lovenox was not given in the ER.   PAST MEDICAL HISTORY:  Hypertension, hyperlipidemia, palpitations, probably chronic atrial fibrillation, on baby aspirin, benign prostatic hypertrophy with recent history of hematuria.   PAST SURGICAL HISTORY:  Hernia repair and cataract surgery.   ALLERGIES:  He has no known  drug allergies.   PSYCHOSOCIAL HISTORY:  Lives in a nursing home.  No history of smoking.    FAMILY HISTORY:  Noncontributory.    REVIEW OF SYSTEMS:   Unobtainable as the patient is sleepy and tired and being a poor historian.   PHYSICAL EXAMINATION:  VITAL SIGNS:  Temperature 98.4, pulse 102, respirations 24, blood pressure 171/99, pulse ox 97%. GENERAL APPEARANCE:  Not under acute distress.  He is lethargic, but arousable, but answering one or two words only.  No family members are available at bedside.  HEENT:  Normocephalic, atraumatic.  Pupils are equally reacting to light and accommodation.  No scleral icterus.  No conjunctival injection.  No sinus tenderness.  Dry mucous membranes.  NECK:  Supple.  No JVD.  No thyromegaly.  LUNGS:  Coarse breath sounds, moderate air entry.  No anterior chest wall tenderness on palpation, but in the left substernal area is a small 1.5 cm and 1.5 cm subcutaneous cystic swelling is present which is nontender.  CARDIOVASCULAR:  Irregularly irregular.  GASTROINTESTINAL:  Soft.  Bowel sounds are positive in all four quadrants.  Nontender, nondistended.  No masses felt.  No rebound tenderness.  NEUROLOGIC:  Arousable, but very lethargic.  Spontaneously moving his extremities.  EXTREMITIES:  No edema.  No cyanosis.  No clubbing.  SKIN:  Multiple bruises were noticed on the anterior chest wall, upper extremities and in the mid axillary line close to the axillary area.  MUSCULOSKELETAL:  No  joint effusion.  No tenderness.  PSYCHIATRIC:  Mood and affect could not be elicited as the patient is lethargic.   LABORATORY AND IMAGING STUDIES:  CPK-MB 4.7.  Troponin 4.20, glucose 111.  Chem-8 is normal except potassium at 3.2, bicarb at 37, anion gap at 3, calcium at 8.2.  LFTs:  Albumin 3.1, bili total 2.0, alkaline phosphatase 64, AST 52, ALT 40, WBC 4.9, hemoglobin 12.2, hematocrit 36.9, platelets are 105, MCV 101.  PT 15.2, INR 1.2.  Urinalysis, yellow in color,  cloudy in appearance, ketones are trace, nitrites are negative, leukocyte esterase 2+.  WBC clumps are present.  A 12-lead EKG has revealed atrial fibrillation with premature complexes, with ventricular rate at 95, nonspecific ST-T wave changes, prolonged QT at 497, corrected QT.  CT of the chest with contrast has revealed no evidence of pneumothorax, small right-sided pleural effusion with minimal associated atelectasis, a few scattered blebs at the lung apices.  Diffuse coronary artery calcification 3.3 x 1.9 cm subcutaneous cystic nodule anterior to the left side of the inferior sternum which might reflect benign cyst.   ASSESSMENT AND PLAN:  A 81 year old elderly male brought into the ER from nursing home for cough and possible pneumothorax per chest x-ray done at nursing home will be admitted with the following assessment and plan.  1.  Probably acute bronchitis.  Blood cultures were obtained.  We will provide him IV levofloxacin.  2.  Acute cystitis.  Urine culture is ordered.  The patient will be on IV levofloxacin.  3.  Non-ST-elevation myocardial infarction with multiple bruises and thrombocytopenia.  The patient will be on acute coronary syndrome protocol with oxygen, nitroglycerin, aspirin, beta blocker and statin.  Cycle cardiac biomarkers.  I will hold Lovenox as the patient has a recent history of hematuria on February 19th and thrombocytopenic with multiple bruises.  Cardiology consult is placed to Dr. Gwen Pounds who is on-call.  Call placed and awaiting call back.  4.  History of benign prostatic hypertrophy with recent history of hematuria on February 19th, evaluated by Dr. Achilles Dunk.  We will continue his home medications, tamsulosin.  5.  Hypertension.  The patient will be on beta blocker.  6.  Hyperlipidemia.  We will continue statin and check fasting lipid panel.   7.  Benign cyst on the midsternal chest wall.  The patient is a poor historian and lethargic.  I could not reach wife and could  not leave any message as the phone is not connected to voicemail.   Total time spent on admission is 45 minutes.    CONDITION:  Guarded.   ____________________________ Ramonita Lab, MD ag:ea D: 09/21/2013 05:30:43 ET T: 09/21/2013 06:02:28 ET JOB#: [id number]  cc: Ramonita Lab, MD, <Dictator> Ramonita Lab MD ELECTRONICALLY SIGNED 10/05/2013 4:39

## 2014-11-18 NOTE — Discharge Summary (Signed)
PATIENT NAME:  Wayne Cruz, Wayne Cruz MR#:  [id number] DATE OF BIRTH:  Sep 27, 1909  DATE OF ADMISSION:  01/02/2014 DATE OF DISCHARGE:  01/04/2014  DISCHARGE DIAGNOSES: 1.  Small bowel obstruction.  2.  Elevated troponin secondary to demand ischemia.  3.  Pancytopenia.  4.  Dementia.   CONSULTATIONS:  1.  Dr. Harvie JuniorPhifer.  2.  Dr. Egbert GaribaldiBird.   ADMITTING HISTORY AND PHYSICAL AND HOSPITAL COURSE:  Please see detailed H and P dictated by Dr. Allena KatzPatel. In brief, a 81 year old African American male patient with GERD, hyperlipidemia and BPH presented to the hospital with nausea, vomiting, was found to have small bowel obstruction. Considering his age and multiple comorbidities, no surgery was planned by Dr. Egbert GaribaldiBird of surgery and admitted to the medical service. The patient was continued on an NG tube, but did not improve with conservative management. Later, palliative care was involved and after extensive discussions with the patient's daughter, wife, the family decided on comfort measures, and the patient was started on morphine, Ativan p.r.n. and transferred to hospice home. At time of discharge, the patient had a poor prognosis and for his end of life care is being transferred to the hospice home.   DISCHARGE MEDICATIONS: 1.  Morphine 20 mg/mL 0.25 to 0.5 mL every 1 to 2 hours as needed for pain or dyspnea.  2.  Lorazepam 0.5 mg oral 1 to 2 tablets oral or sublingual every 2 to 4 hours as needed.   Time spent on [place] of discharge in discharge activity was 40 minutes.     ____________________________ Molinda BailiffSrikar R. Mccoy Testa, MD srs:dmm D: 01/06/2014 13:01:26 ET T: 01/06/2014 13:14:37 ET JOB#: [id number]  cc: Wardell HeathSrikar R. Pershing Skidmore, MD, <Dictator> Orie FishermanSRIKAR R Austine Kelsay MD ELECTRONICALLY SIGNED 01/13/2014 11:40

## 2014-11-18 NOTE — Consult Note (Signed)
PATIENT NAME:  Wayne DimitriDAY, Wayne Cruz MR#:  [id number] DATE OF BIRTH:  1909-11-12  DATE OF CONSULTATION:  01/02/2014  CONSULTING PHYSICIAN:  Adah Salvageichard E. Excell Seltzerooper, MD   CHIEF COMPLAINT: Not obtainable.   HISTORY OF PRESENT ILLNESS: This is an elderly male patient who I know well from prior admission for incarcerated ventral hernia, which was repaired. Postoperatively, the patient had significant dementia and agitation and required prolonged hospitalization. He was then readmitted several days later; this all being back in February of 2015 for acute non-Q wave MI and bronchitis. He was treated successfully and apparently is readmitted today for small bowel obstruction seen on CT scan. History is only obtainable from the chart. No family is available. The patient is only minimally responsive and noncooperative with discussion. He does not answer any questions, he does not open his eyes, but does grimace and groan periodically. He is 81 years old.  Per the chart, the patient had nausea and vomiting starting yesterday, and again, no family is present.   PAST MEDICAL HISTORY: Hypertension, hyperlipidemia, palpitations, chronic atrial fibrillation, BPH, hematuria, coronary artery disease with non-Q wave MI within the last 5 months.   PAST SURGICAL HISTORY:  Incarcerated ventral hernia repair and coronary stents.  ALLERGIES:  None.   MEDICATIONS:  Multiple, see chart.   FAMILY HISTORY:  Not obtainable.   SOCIAL HISTORY:  From the chart, there is no history of smoking and he resides at home with his wife.   REVIEW OF SYSTEMS:  Not obtainable.   PHYSICAL EXAMINATION: GENERAL:  Quiet, comfortable-appearing male patient. He does not cooperate with the exam.  VITAL SIGNS:  Show a temp of 98. Pulse is 75, respirations 19, blood pressure 165/65, 96% room air sat.  HEENT:  No scleral icterus. There is a nasogastric tube in place draining a large amount of bilious material.  CHEST:  Shows bilateral rhonchi.  CARDIAC:   Regular rate and rhythm.  ABDOMEN:  Distended and tympanitic. Scars noted. No recurrent hernia. Minimal diffuse tenderness without peritoneal signs.  EXTREMITIES:  Show moderate edema.  NEUROLOGIC:  Grossly intact.  INTEGUMENT:  Shows no jaundice.   CT scan is personally reviewed with what appears to be a small bowel obstruction. Transition zone in the right mid abdomen. No free air on KUB or CT scan. Left pleural effusion.   White blood cell count is 3.9, H and H of 12 and 37, and a platelet count 65,000. Electrolytes show a potassium of 2.7, chloride of 95, CO2 of 36, bilirubin of 2.8 and an albumin of 3.3.   ASSESSMENT AND PLAN: This is a patient with a partial small bowel obstruction. He is an extremely poor surgical candidate; myocardial infarction in the last 6 months, following a previous surgery in which he had considerable difficulty with postoperative dementia and agitation. His platelet count is 65,000 for unclear reasons, and would be an extremely poor surgical candidate. No family is around to discuss their plans with him, but I see in the chart that he is DO NOT RESUSCITATE. I will discuss the case with Dr. Egbert GaribaldiBird in the morning and have him addresses this plan with family. Agree with nasogastric tube and re-examination.   ____________________________ Adah Salvageichard E. Excell Seltzerooper, MD rec:dmm D: 01/02/2014 20:35:29 ET T: 01/02/2014 20:54:08 ET JOB#: [id number]  cc: Adah Salvageichard E. Excell Seltzerooper, MD, <Dictator> Lattie HawICHARD E Cherrie Franca MD ELECTRONICALLY SIGNED 01/03/2014 0:26

## 2014-11-18 NOTE — H&P (Signed)
PATIENT NAME:  Wayne Cruz, DESA MR#:  [id number] DATE OF BIRTH:  May 13, 1910  DATE OF ADMISSION:  09/10/2013  CHIEF COMPLAINT:  Supraumbilical bulge and pain.   HISTORY OF PRESENT ILLNESS:  This is a 81 year old male patient with a history of abdominal pain since 7:30 this morning.  He has never been told he had a hernia, never knew he had a hernia and has never had pain or bulge before, but I was called to the Emergency Room by Dr. Glenetta Hew who stated that she had a patient with a ventral hernia that was not reducible.  She had tried to reduce it and was unable.   The patient denies any nausea, vomiting, fevers or chills.  He has never had an episode like this before.  He has had a normal bowel movement this morning,   PAST MEDICAL HISTORY:  Coronary artery disease and hypertension.   PAST SURGICAL HISTORY:  No abdominal surgery.  Possible cataract surgery.   ALLERGIES:  None.   MEDICATIONS:  Multiple, see chart.  He is not on Plavix, but is on aspirin.  He had stents placed two years ago, but is not on any antiplatelet drug other than aspirin.   FAMILY HISTORY:  Noncontributory.   SOCIAL HISTORY:  The patient does not smoke or drink.   REVIEW OF SYSTEMS:  A 10 system review is performed and negative with the exception of that mentioned in the HPI.   PHYSICAL EXAMINATION: GENERAL:  Healthy 81 year old male.  He appears younger than his stated age and is quite conversant, but hard of hearing.  VITAL SIGNS:  Temp of 97.6, pulse of 52, respirations 20, blood pressure 157/67.  He had a pain scale of 0 early on.  HEENT:  No scleral icterus.  NECK:  No palpable neck nodes.  CHEST:  Clear to auscultation.  CARDIAC:  Regular rate and rhythm.  ABDOMEN:  Soft, nondistended.  No guarding, no rebound.  No percussion tenderness.  In the supraumbilical area there is approximately a 7 to 8 cm bulge which is firm.  There is no overlying erythema.  It is tender, but only to attempts at reduction and  nonreducible.  EXTREMITIES:  Without edema.  NEUROLOGIC:  Grossly intact.  INTEGUMENT:  No jaundice.   DIAGNOSTIC STUDIES:  No radiologic studies have been performed.   Lactic acid is 2.0, hemoglobin and hematocrit of 14 and 43.  White blood cell count is 6.4, platelet count pending.  Electrolytes are within normal limits.   ASSESSMENT AND PLAN:  This is a patient with an incarcerated supraumbilical ventral hernia.  He has coronary artery disease with stents.  He is 81 years old, but in fairly good health.  I discussed with Dr. Glenetta Hew the need for emergent surgery in this patient as I had made an attempt at reducing this with him in the head down and supine position.  I was unable to reduce it manually.  In fact, it did not move at all and was quite tender to deep palpation (Dr. Glenetta Hew had relayed the same findings herself).   With this in mind, I believe the patient needs emergent surgery.  The rationale for surgery has been discussed with the family.  The options of observation have been reviewed.  The risks of bleeding, infection, mesh placement, mesh infection, inability to place mesh, recurrence, bowel injury and of course anesthetic risks due to his age have been reviewed with the family and the patient.  They understood and agreed to  proceed.  Dr. Glenetta HewMcLaurin asked about having medicine see the patient preoperatively, but his labs are fairly normal and he has been on beta-blockers, therefore I do not see that internal medicine will add very much prior to this emergent surgery.  I may ask them to see him postoperatively if necessary depending on the findings and his progress.  Family was in agreement with this plan as was the patient.    ____________________________ Adah Salvageichard E. Excell Seltzerooper, MD rec:ea D: 09/10/2013 14:57:24 ET T: 09/10/2013 16:57:40 ET JOB#: [id number]  cc: Adah Salvageichard E. Excell Seltzerooper, MD, <Dictator> Lattie HawICHARD E Noble Cicalese MD ELECTRONICALLY SIGNED 09/10/2013 18:49

## 2014-11-18 NOTE — Consult Note (Signed)
Wayne Cruz was seen by Dr. Jolyne LoaBelsante over the weekend for urinary retention.  A Foley catheter was placed.  He has developed dark red hematuria.  He has maintained reasonable urine output.  There have been no significant clots.  There was approximately 800 mL in his bladder when the catheter was placed.  There was some initial difficulty.  A subsequent coud catheter was easily passed.  He has a history of BPH with prior TURP.  There is a high probability of recurrent prostate tissue was some hypervascularity.  This is often easy to bleed with any type of manipulation.  With the lack of clots and his current situation, I feel it is reasonable to try to give him a trial off the Foley catheter.  He was unable to provide much information regarding his prior voiding habits.  He has been utilizing Flomax.  I would recommend removing the Foley catheter in the morning with subsequent bladder scan.  If he does void adequately, follow-up in several weeks would be otherwise all that is indicated.  There is no indication for bladder irrigation at present.  If any degree of hematuria persist, further evaluation with cystoscopy will be indicated.  If there is recurrent urinary retention, catheter replacement with then be recommended.  If there are any further questions, please feel free to contact us.  I will not be available after 10 AM on Friday for any intervention.  I will be available, however to take calls if needed.  If the catheter does need to be replaced, an 18-French coud catheter is recommended.   Electronic Signatures: Smith Robertope, Berkley Cronkright S (MD)  (Signed on 19-Feb-15 22:43)  Authored  Last Updated: 19-Feb-15 22:43 by Smith Robertope, Christianjames Soule S (MD)

## 2015-10-13 IMAGING — CT CT HEAD WITHOUT CONTRAST
3 of 5 series · 16 of 30 positions shown, 18 images · non-contrast
Comparison: 07/23/2008.

CLINICAL DATA: Weakness worse than normal. Vomiting since [REDACTED]
12/29/2013.

EXAM:
CT HEAD WITHOUT CONTRAST
TECHNIQUE: Contiguous axial images were obtained from the base of the skull
through the vertex without intravenous contrast.

[Series 2: soft tissue · axial · 0.40mm/px · z∈[-167,-37]mm · 8 of 34 slices shown, 10 images]
[im 4/34  brain]
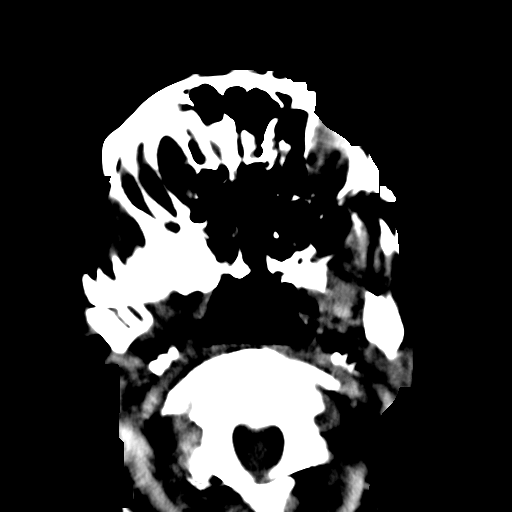
[im 4/34  bone]
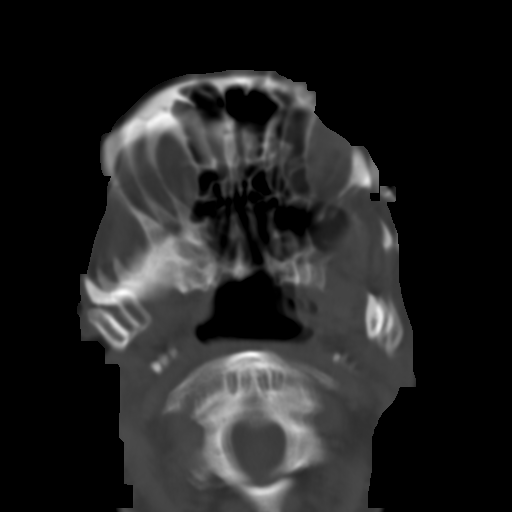
[im 8/34  brain]
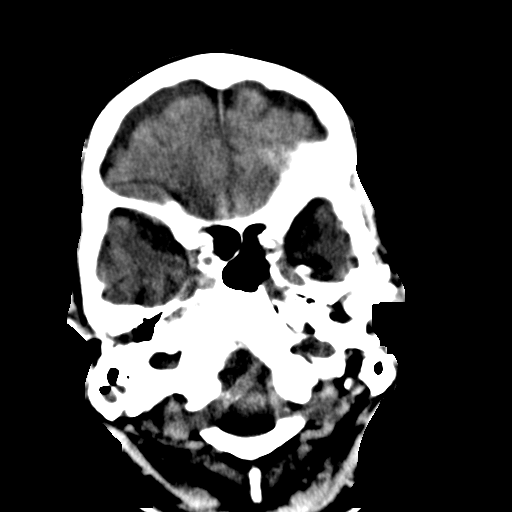
[im 12/34  brain]
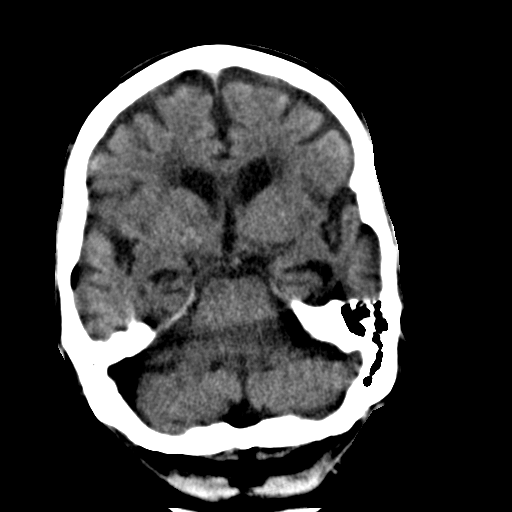
[im 15/34  brain]
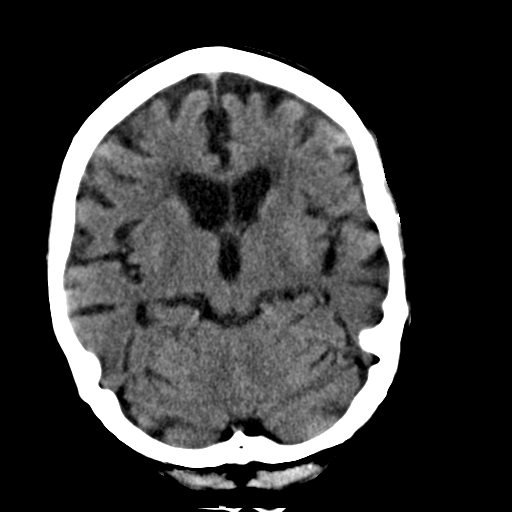
[im 19/34  brain]
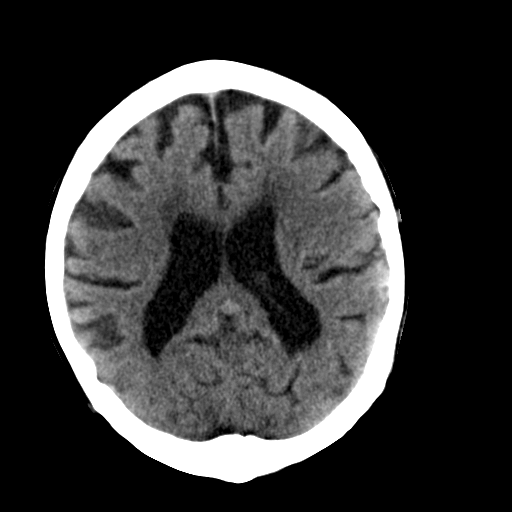
[im 19/34  bone]
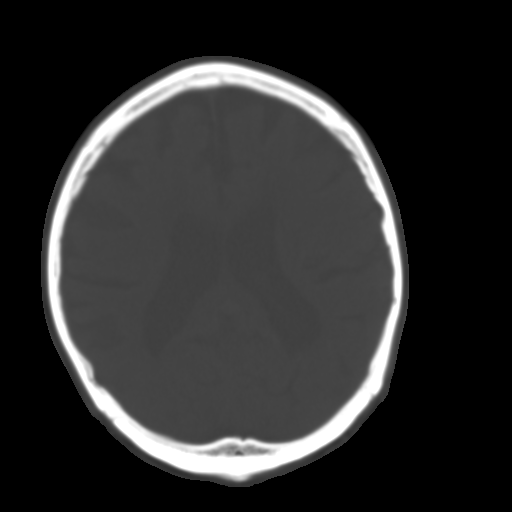
[im 23/34  brain]
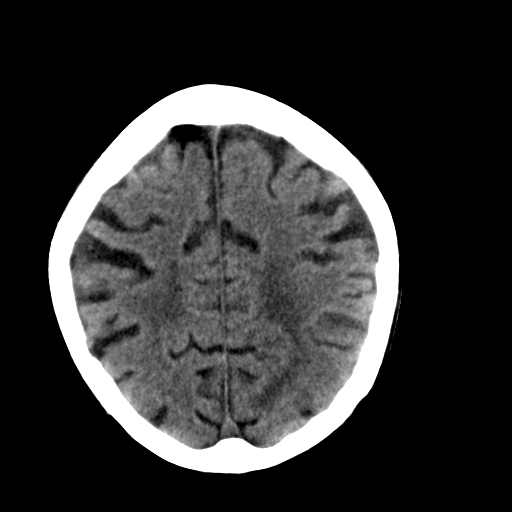
[im 26/34  brain]
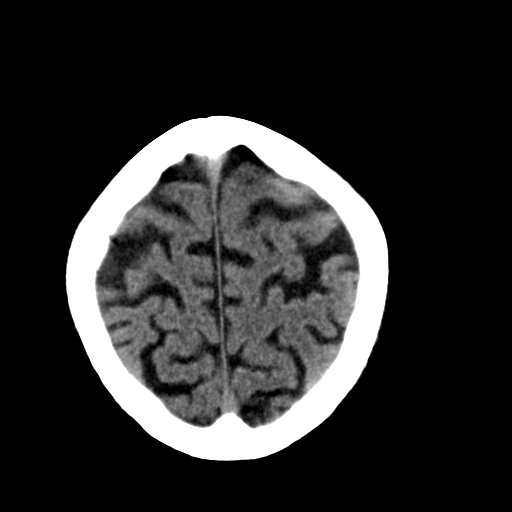
[im 30/34  brain]
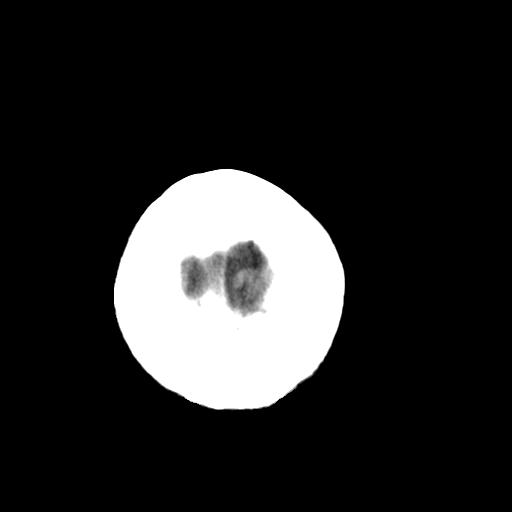

[Series 4: soft tissue repeat · axial · 0.40mm/px · z∈[-162,-137]mm · 2 of 15 slices shown]
[im 5/15  brain]
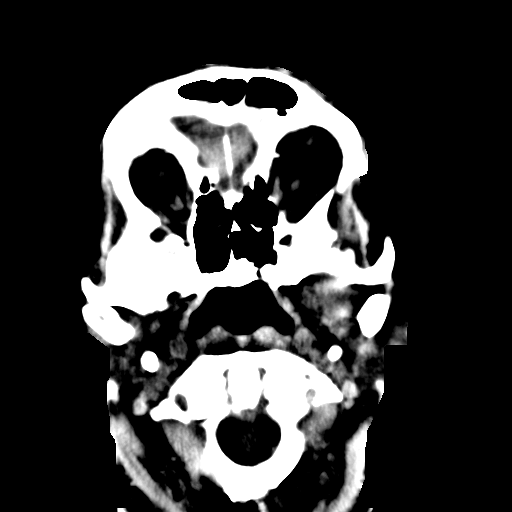
[im 10/15  brain]
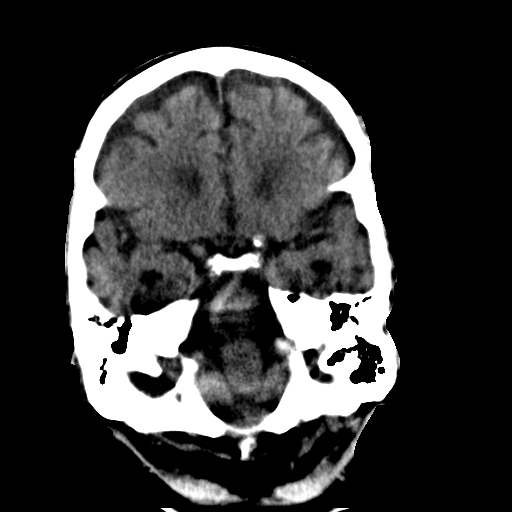

[Series 6: soft tissue recons · axial · 0.40mm/px · z∈[-178,-81]mm · 6 of 30 slices shown]
[im 5/30  brain]
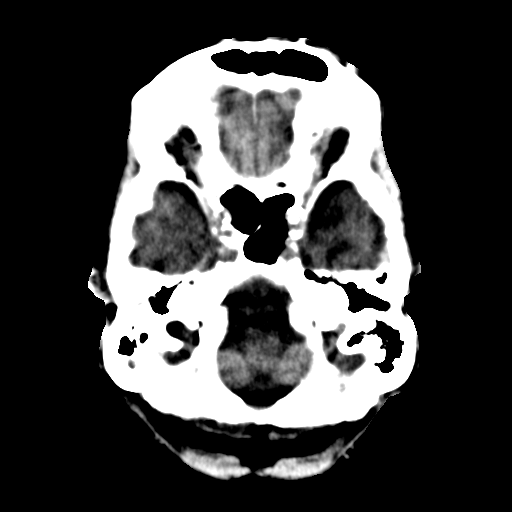
[im 9/30  brain]
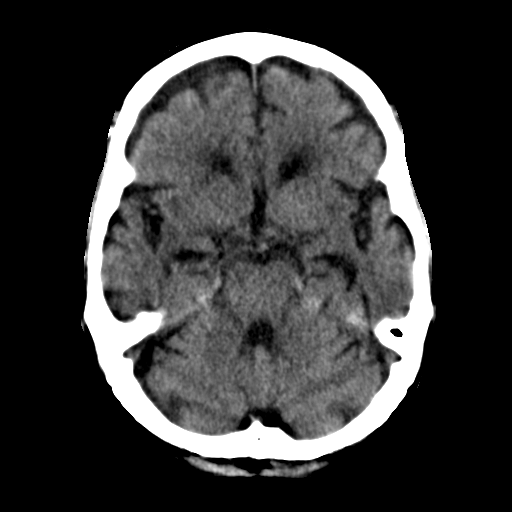
[im 13/30  brain]
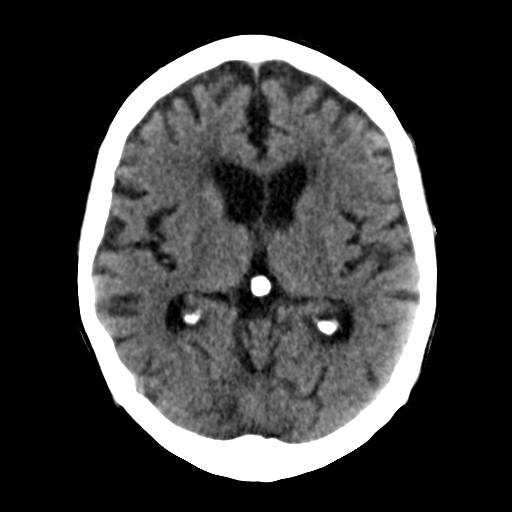
[im 17/30  brain]
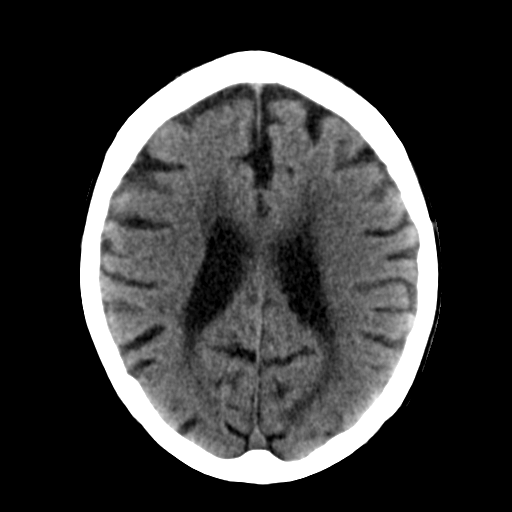
[im 21/30  brain]
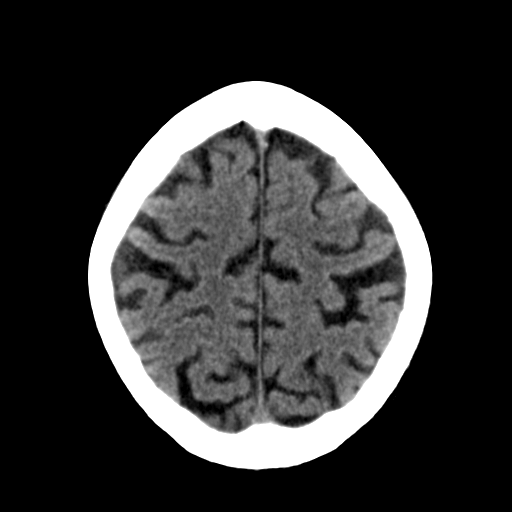
[im 25/30  brain]
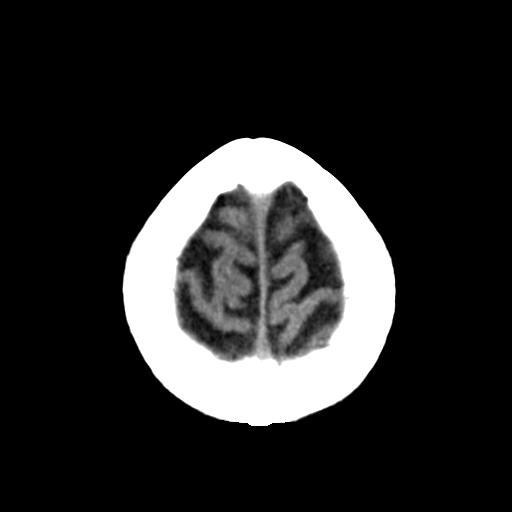

[16 of 30 positions shown; findings below may reference images not displayed]

FINDINGS: Exam is motion degraded.  Sequences repeated.

No intracranial hemorrhage.

Small vessel disease type changes without CT evidence of large acute
infarct.

Mild global atrophy without hydrocephalus.

No intracranial mass lesion noted on this unenhanced exam.

Vascular calcifications.

Partial opacification inferior right mastoid air cell. Prominent
cerumen right ear.
IMPRESSION: Exam is motion degraded.  Sequences repeated.

No intracranial hemorrhage.

Small vessel disease type changes without CT evidence of large acute
infarct.

Mild global atrophy without hydrocephalus.

Partial opacification inferior right mastoid air cell. Prominent
cerumen right ear.

## 2015-10-14 IMAGING — CR DG ABDOMEN 2V
1 series · 3 of 3 positions shown · non-contrast
Comparison: 01/02/2014 and CT 01/02/2014.

CLINICAL DATA: Poor historian, evaluate for small bowel
obstruction.

EXAM:
ABDOMEN - 2 VIEW

[Series 6: x abdomen supine · 0.14mm/px · 3 of 3 slices shown]
[im 1/3]
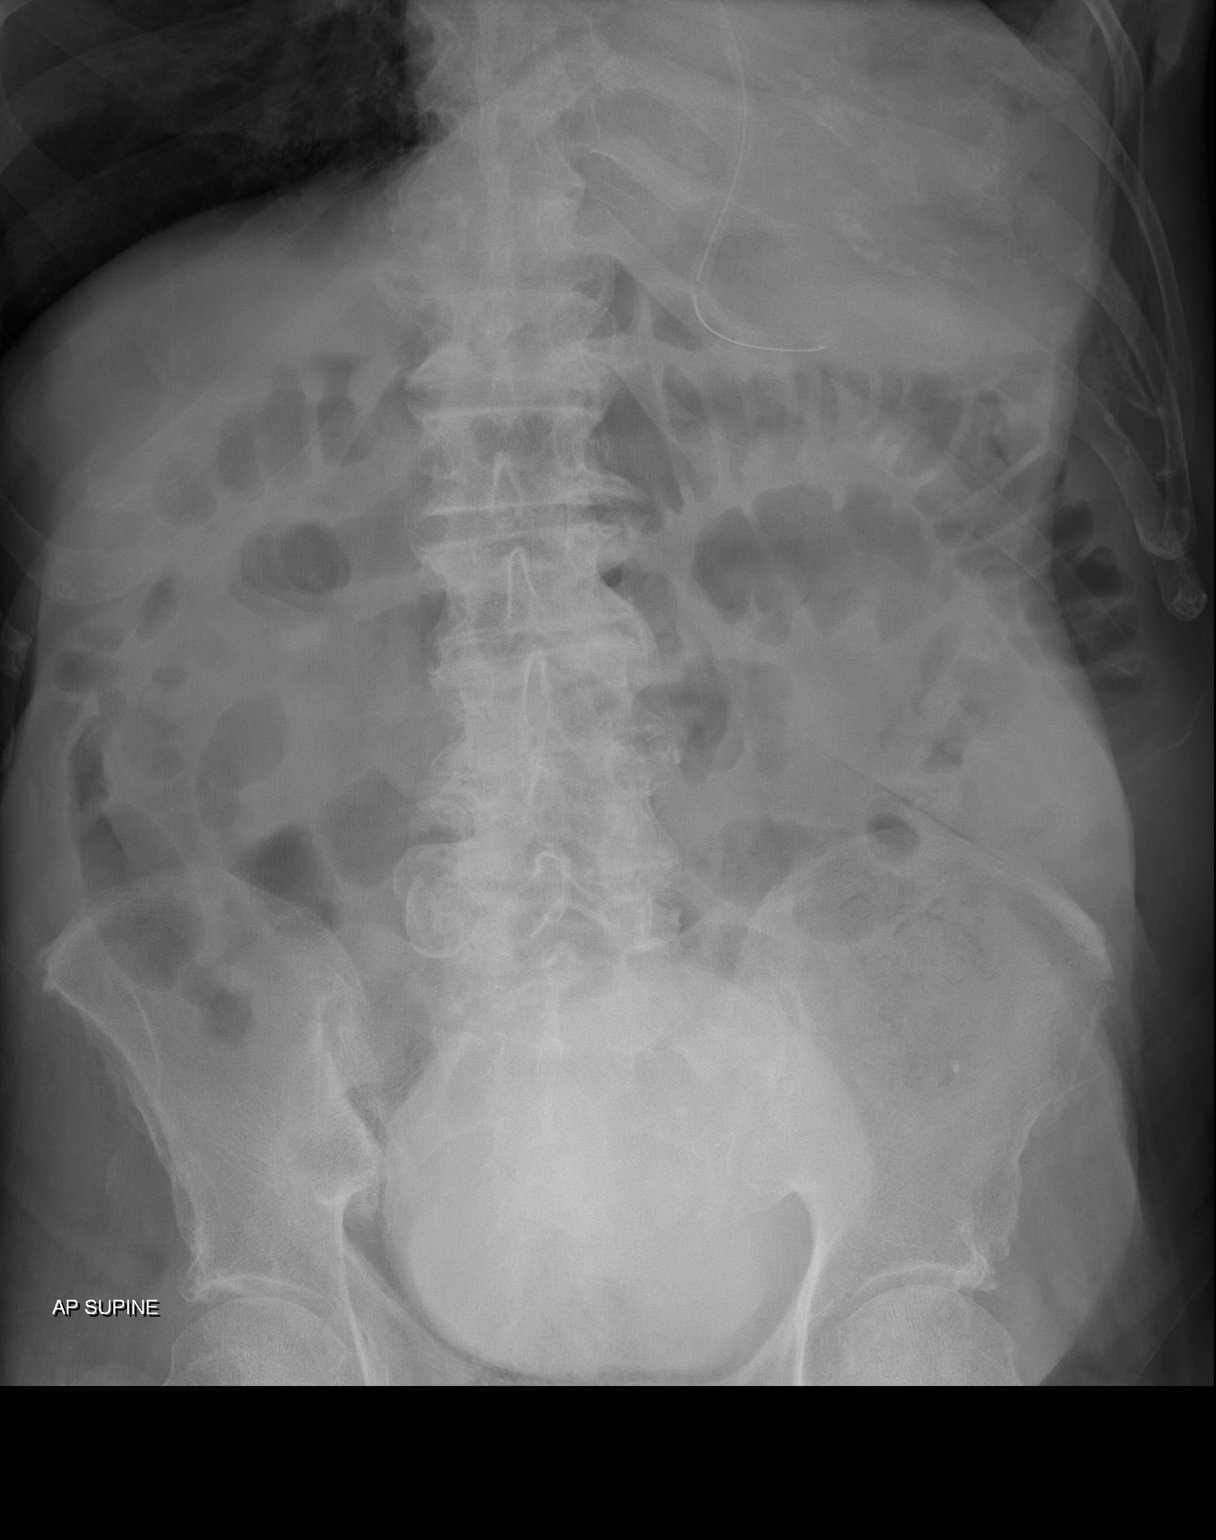
[im 2/3]
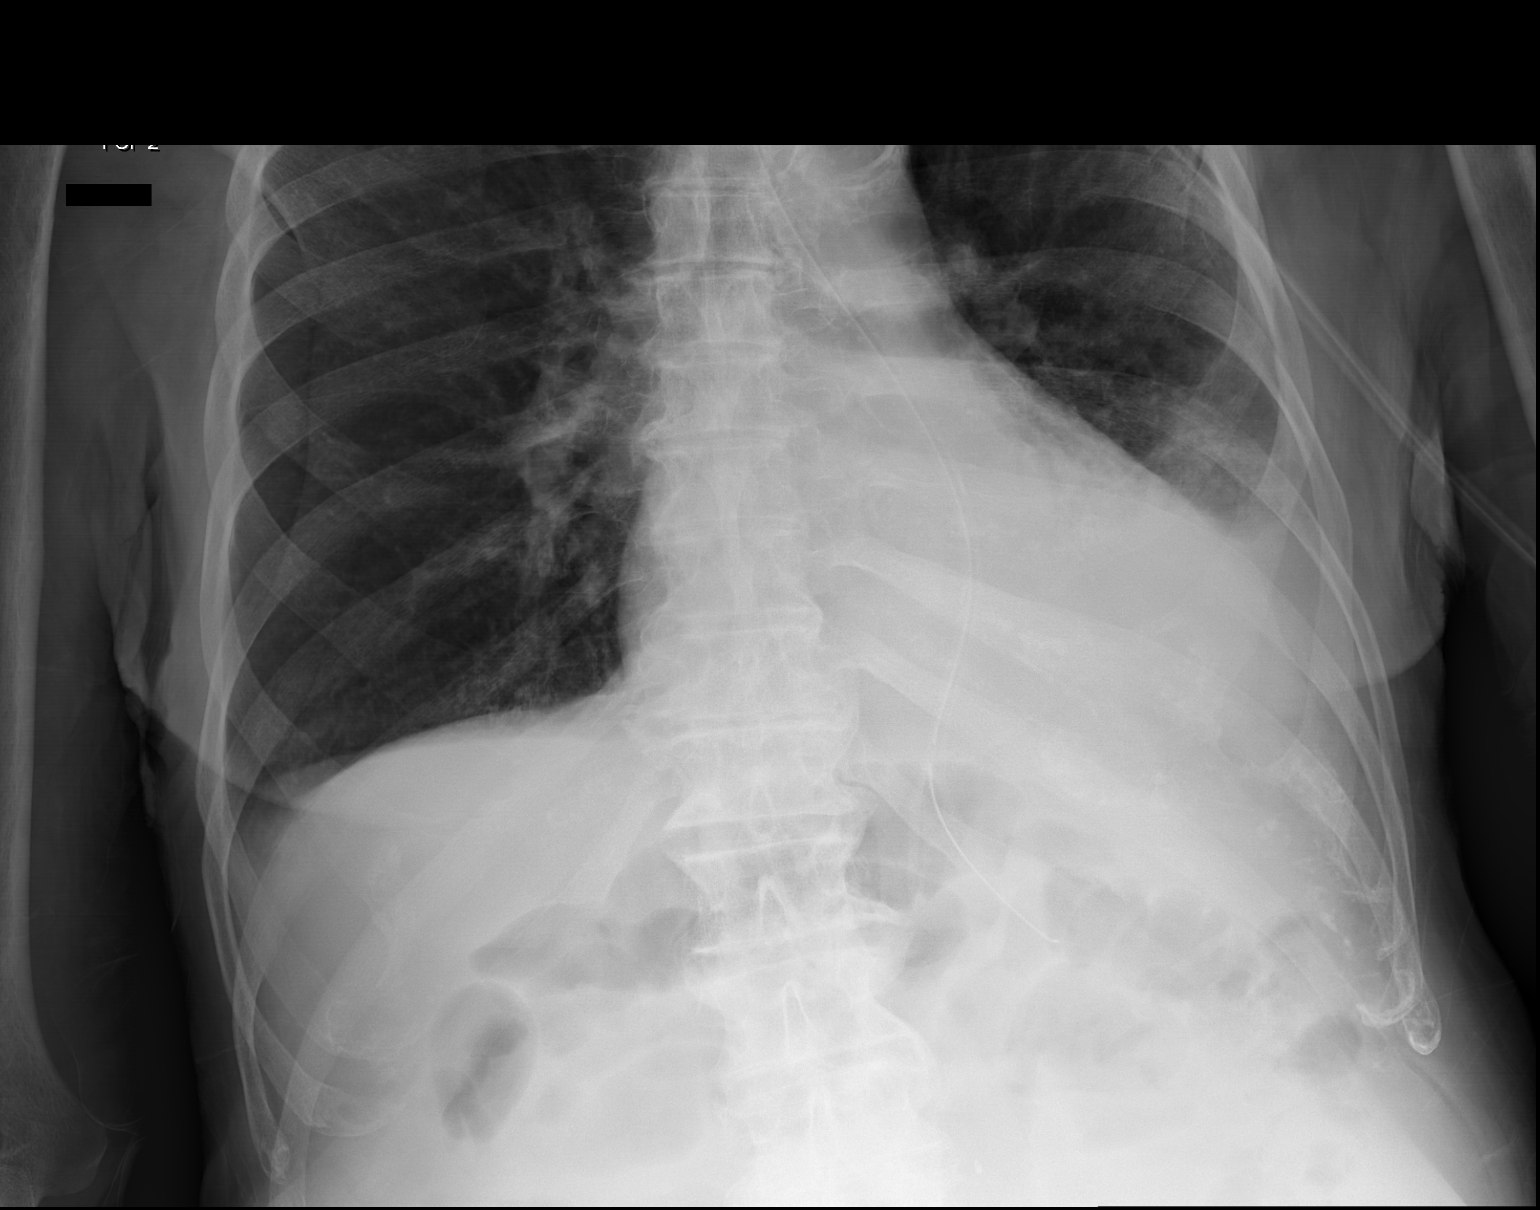
[im 3/3]
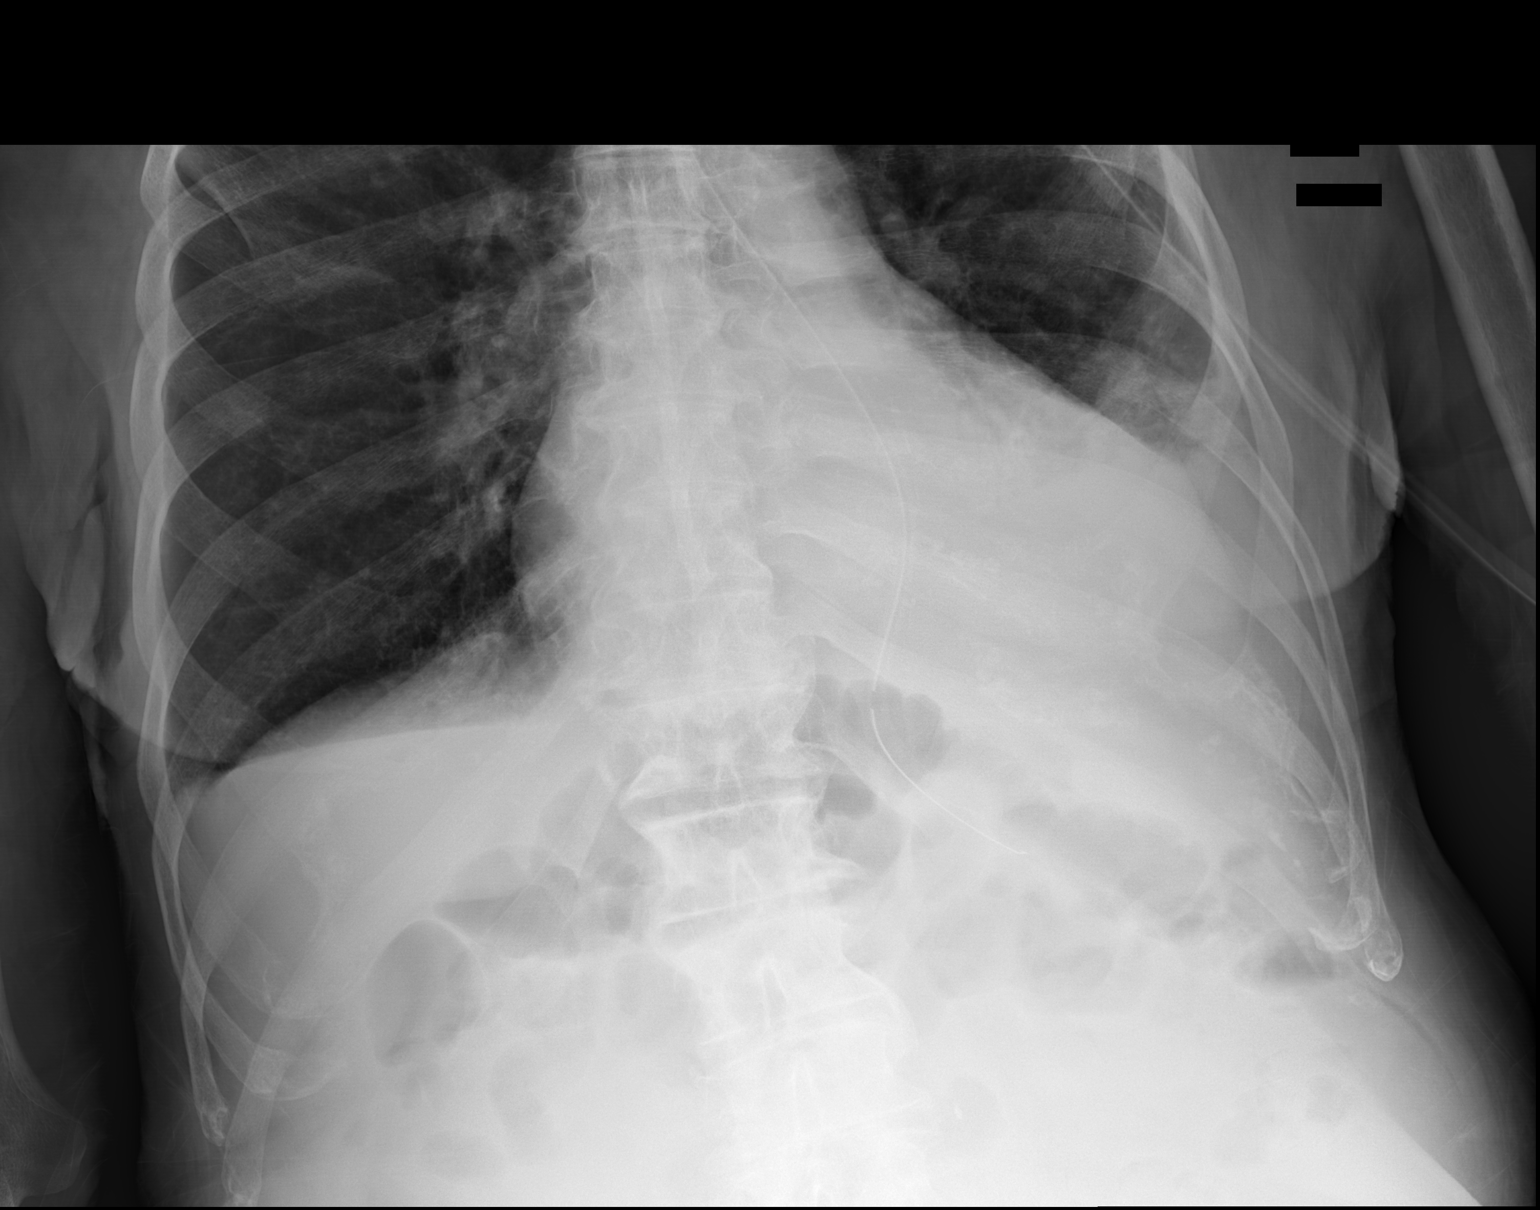

[3 of 3 positions shown; findings below may reference images not displayed]

FINDINGS: Nasogastric tube is present with tip over the stomach in the left
upper quadrant and side-port in the region of the gastroesophageal
junction. There is continued moderate opacification over the left
lung base likely a moderate size effusion with atelectasis. Stable
cardiomegaly.

Abdominal pelvic images demonstrate contrast within a mild to
moderately distended bladder. There has been moderate interval
improvement in the previously seen air-filled dilated small bowel
loops with only a few residual mildly prominent air-filled small
bowel loops remaining. No evidence of free peritoneal air. There is
moderate fecal retention over the rectosigmoid colon. Remainder the
exam is unchanged.
IMPRESSION: Significant interval improvement in patient's bowel gas pattern with
near resolution of the previously seen multiple dilated air-filled
small bowel loops. No free air. Moderate fecal retention over the
rectosigmoid colon.

Nasogastric tube with tip over the region of the stomach and side
port in the region of the gastroesophageal junction. This could be
advanced approximately 5 cm.

Stable mild to moderate left base effusion/atelectasis.
